# Patient Record
Sex: Female | Born: 1975 | Race: White | Hispanic: No | Marital: Married | Smoking: Never smoker
Health system: Southern US, Community
[De-identification: ages and names within clinical notes are randomized; demographics above are authoritative.]

## PROBLEM LIST (undated history)

## (undated) DIAGNOSIS — R5383 Other fatigue: Secondary | ICD-10-CM

## (undated) DIAGNOSIS — K219 Gastro-esophageal reflux disease without esophagitis: Secondary | ICD-10-CM

## (undated) DIAGNOSIS — J45909 Unspecified asthma, uncomplicated: Secondary | ICD-10-CM

## (undated) DIAGNOSIS — U071 COVID-19: Secondary | ICD-10-CM

## (undated) DIAGNOSIS — F909 Attention-deficit hyperactivity disorder, unspecified type: Secondary | ICD-10-CM

## (undated) DIAGNOSIS — T7840XA Allergy, unspecified, initial encounter: Secondary | ICD-10-CM

## (undated) DIAGNOSIS — F419 Anxiety disorder, unspecified: Secondary | ICD-10-CM

## (undated) HISTORY — DX: Attention-deficit hyperactivity disorder, unspecified type: F90.9

## (undated) HISTORY — DX: COVID-19: U07.1

## (undated) HISTORY — PX: LAMINECTOMY: SHX219

## (undated) HISTORY — DX: Allergy, unspecified, initial encounter: T78.40XA

## (undated) HISTORY — DX: Other fatigue: R53.83

## (undated) HISTORY — DX: Anxiety disorder, unspecified: F41.9

## (undated) HISTORY — PX: SPINE SURGERY: SHX786

## (undated) HISTORY — DX: Unspecified asthma, uncomplicated: J45.909

## (undated) HISTORY — DX: Gastro-esophageal reflux disease without esophagitis: K21.9

---

## 1995-08-31 DIAGNOSIS — Z8742 Personal history of other diseases of the female genital tract: Secondary | ICD-10-CM | POA: Insufficient documentation

## 1999-01-20 ENCOUNTER — Other Ambulatory Visit: Admission: RE | Admit: 1999-01-20 | Discharge: 1999-01-20 | Payer: Self-pay | Admitting: Obstetrics and Gynecology

## 1999-11-16 ENCOUNTER — Other Ambulatory Visit: Admission: RE | Admit: 1999-11-16 | Discharge: 1999-11-16 | Payer: Self-pay | Admitting: Obstetrics & Gynecology

## 2000-01-13 ENCOUNTER — Ambulatory Visit (HOSPITAL_COMMUNITY): Admission: RE | Admit: 2000-01-13 | Discharge: 2000-01-13 | Payer: Self-pay | Admitting: Obstetrics & Gynecology

## 2000-01-13 ENCOUNTER — Encounter: Payer: Self-pay | Admitting: Obstetrics & Gynecology

## 2000-01-27 ENCOUNTER — Ambulatory Visit (HOSPITAL_COMMUNITY): Admission: RE | Admit: 2000-01-27 | Discharge: 2000-01-27 | Payer: Self-pay | Admitting: Obstetrics and Gynecology

## 2000-01-27 ENCOUNTER — Encounter: Payer: Self-pay | Admitting: Obstetrics and Gynecology

## 2000-03-27 ENCOUNTER — Inpatient Hospital Stay (HOSPITAL_COMMUNITY): Admission: AD | Admit: 2000-03-27 | Discharge: 2000-03-27 | Payer: Self-pay | Admitting: Obstetrics & Gynecology

## 2000-04-11 ENCOUNTER — Inpatient Hospital Stay (HOSPITAL_COMMUNITY): Admission: AD | Admit: 2000-04-11 | Discharge: 2000-04-11 | Payer: Self-pay | Admitting: Obstetrics and Gynecology

## 2000-06-13 ENCOUNTER — Inpatient Hospital Stay (HOSPITAL_COMMUNITY): Admission: AD | Admit: 2000-06-13 | Discharge: 2000-06-15 | Payer: Self-pay | Admitting: Obstetrics and Gynecology

## 2000-12-14 ENCOUNTER — Other Ambulatory Visit: Admission: RE | Admit: 2000-12-14 | Discharge: 2000-12-14 | Payer: Self-pay | Admitting: Obstetrics and Gynecology

## 2003-05-29 ENCOUNTER — Ambulatory Visit (HOSPITAL_COMMUNITY): Admission: RE | Admit: 2003-05-29 | Discharge: 2003-05-29 | Payer: Self-pay | Admitting: Obstetrics and Gynecology

## 2003-05-29 ENCOUNTER — Encounter: Payer: Self-pay | Admitting: Obstetrics and Gynecology

## 2004-04-23 ENCOUNTER — Ambulatory Visit (HOSPITAL_COMMUNITY): Admission: RE | Admit: 2004-04-23 | Discharge: 2004-04-23 | Payer: Self-pay | Admitting: Obstetrics and Gynecology

## 2004-08-14 ENCOUNTER — Ambulatory Visit (HOSPITAL_BASED_OUTPATIENT_CLINIC_OR_DEPARTMENT_OTHER): Admission: RE | Admit: 2004-08-14 | Discharge: 2004-08-14 | Payer: Self-pay | Admitting: Urology

## 2004-08-14 ENCOUNTER — Ambulatory Visit (HOSPITAL_COMMUNITY): Admission: RE | Admit: 2004-08-14 | Discharge: 2004-08-14 | Payer: Self-pay | Admitting: Urology

## 2005-03-04 ENCOUNTER — Observation Stay (HOSPITAL_COMMUNITY): Admission: RE | Admit: 2005-03-04 | Discharge: 2005-03-05 | Payer: Self-pay | Admitting: Specialist

## 2008-04-12 ENCOUNTER — Ambulatory Visit: Payer: Self-pay | Admitting: Family Medicine

## 2011-08-20 ENCOUNTER — Ambulatory Visit: Payer: Self-pay | Admitting: Family Medicine

## 2015-02-06 ENCOUNTER — Other Ambulatory Visit: Payer: Self-pay | Admitting: Family Medicine

## 2015-02-06 DIAGNOSIS — F329 Major depressive disorder, single episode, unspecified: Secondary | ICD-10-CM

## 2015-02-06 DIAGNOSIS — F32A Depression, unspecified: Secondary | ICD-10-CM

## 2015-03-31 ENCOUNTER — Other Ambulatory Visit: Payer: Self-pay | Admitting: Family Medicine

## 2016-11-22 ENCOUNTER — Encounter: Payer: Self-pay | Admitting: Family Medicine

## 2016-11-22 ENCOUNTER — Ambulatory Visit (INDEPENDENT_AMBULATORY_CARE_PROVIDER_SITE_OTHER): Payer: 59 | Admitting: Family Medicine

## 2016-11-22 DIAGNOSIS — K219 Gastro-esophageal reflux disease without esophagitis: Secondary | ICD-10-CM

## 2016-11-22 DIAGNOSIS — E669 Obesity, unspecified: Secondary | ICD-10-CM | POA: Diagnosis not present

## 2016-11-22 DIAGNOSIS — F419 Anxiety disorder, unspecified: Secondary | ICD-10-CM

## 2016-11-22 DIAGNOSIS — J4540 Moderate persistent asthma, uncomplicated: Secondary | ICD-10-CM | POA: Diagnosis not present

## 2016-11-22 MED ORDER — QVAR REDIHALER 80 MCG/ACT IN AERB: 2.0000 | INHALATION_SPRAY | Freq: Two times a day (BID) | RESPIRATORY_TRACT | 5 refills | Status: DC

## 2016-11-22 MED ORDER — SERTRALINE HCL 100 MG PO TABS: 100.0000 mg | ORAL_TABLET | Freq: Every day | ORAL | 2 refills | Status: DC

## 2016-11-22 NOTE — Progress Notes (Signed)
Tommi Rumps, MD Phone: [phone]  Kelly [name] is a 41 y.o. female who presents today for new patient visit.  Anxiety: Patient has been on Zoloft for about 10 years. She feels it is difficult to shut down at night and has difficulty falling asleep at times. Notes it does not feel as though it is working quite as well. She's never had to go up on the dose. 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 She notes no depression.  Asthma: Using Qvar daily 160 g. Is pretty much using her albuterol daily as well. Particularly if working out. No nighttime symptoms. Does wheeze. It's been about 6 months since she required steroids.  She's been working on weight loss by decreasing sweet tea intake. She's also cut down on carbs. She's starting to exercise. Drinking more water.  She has a history of reflux. No longer has any symptoms. She takes medication for this.  Active Ambulatory Problems    Diagnosis Date Noted  . Anxiety 11/23/2016  . Asthma 11/23/2016  . Obesity (BMI 35.0-39.9 without comorbidity) 11/23/2016  . GERD (gastroesophageal reflux disease) 11/23/2016   Resolved Ambulatory Problems    Diagnosis Date Noted  . No Resolved Ambulatory Problems   Past Medical History:  Diagnosis Date  . Allergy   . Anxiety   . Asthma     Family History  Problem Relation Age of Onset  . Hypertension Mother   . Non-Hodgkin's lymphoma Father     Social History   Social History  . Marital status: Single    Spouse name: N/A  . Number of children: N/A  . Years of education: N/A   Occupational History  . Not on file.   Social History Main Topics  . Smoking status: Never Smoker  . Smokeless tobacco: Never Used  . Alcohol use No  . Drug use: No  . Sexual activity: Not on file   Other Topics Concern  . Not on file   Social History Narrative  . No narrative on file    ROS  General:  Negative for nexplained weight loss, fever Skin: Negative for new or  changing mole, sore that won't heal HEENT: Negative for trouble hearing, trouble seeing, ringing in ears, mouth sores, hoarseness, change in voice, dysphagia. CV:  Negative for chest pain, dyspnea, edema, palpitations Resp: Negative for cough, dyspnea, hemoptysis GI: Negative for nausea, vomiting, diarrhea, constipation, abdominal pain, melena, hematochezia. GU: Negative for dysuria, incontinence, urinary hesitance, hematuria, vaginal or penile discharge, polyuria, sexual difficulty, lumps in testicle or breasts MSK: Negative for muscle cramps or aches, joint pain or swelling Neuro: Negative for headaches, weakness, numbness, dizziness, passing out/fainting Psych: Positive for anxiety, Negative for depression, memory problems  Objective  Physical Exam Vitals:   11/22/16 1338  BP: 130/90  Pulse: 95  Temp: 99.2 F (37.3 C)    BP Readings from Last 3 Encounters:  11/22/16 130/90   Wt Readings from Last 3 Encounters:  11/22/16 204 lb (92.5 kg)    Physical Exam  Constitutional: No distress.  HENT:  Head: Normocephalic and atraumatic.  Mouth/Throat: Oropharynx is clear and moist. No oropharyngeal exudate.  Eyes: Conjunctivae are normal. Pupils are equal, round, and reactive to light.  Neck: Neck supple.  Cardiovascular: Normal rate, regular rhythm and normal heart sounds.   Pulmonary/Chest: Effort normal and breath sounds normal.  Abdominal: Soft. Bowel sounds are normal. She exhibits no distension. There is no tenderness. There is no rebound and  no guarding.  Musculoskeletal: She exhibits no edema.  Lymphadenopathy:    She has no cervical adenopathy.  Neurological: She is alert. Gait normal.  Skin: Skin is warm and dry. She is not diaphoretic.  Psychiatric:  Mood anxious, affect anxious     Assessment/Plan:   Anxiety Patient with slight worsening of anxiety. We will have her increase her Zoloft to 100 mg daily. She'll follow-up in about 8 weeks to see if this is  beneficial.  Asthma Patient with daily symptoms. We will increase her Qvar to 160 g twice daily. She can continue to use her albuterol as needed. She will use 2 puffs prior to exercising to see if this is beneficial. We'll see how she does at follow-up in 2 months.  Obesity (BMI 35.0-39.9 without comorbidity) Advised to continue to work on diet and exercise.  GERD (gastroesophageal reflux disease) Symptomatically. Continue PPI. Continue to monitor.   No orders of the defined types were placed in this encounter.    Tommi Rumps, MD [place]

## 2016-11-22 NOTE — Patient Instructions (Signed)
Nice to meet you. We will increase your Zoloft. If your anxiety worsens please let us know. We'll also increase your Qvar. If your wheezing worsens please let us know. Please continue with diet and exercise.

## 2016-11-22 NOTE — Progress Notes (Signed)
Pre visit review using our clinic review tool, if applicable. No additional management support is needed unless otherwise documented below in the visit note. 

## 2016-11-23 DIAGNOSIS — F419 Anxiety disorder, unspecified: Secondary | ICD-10-CM | POA: Insufficient documentation

## 2016-11-23 DIAGNOSIS — E669 Obesity, unspecified: Secondary | ICD-10-CM | POA: Insufficient documentation

## 2016-11-23 DIAGNOSIS — K219 Gastro-esophageal reflux disease without esophagitis: Secondary | ICD-10-CM | POA: Insufficient documentation

## 2016-11-23 DIAGNOSIS — J45909 Unspecified asthma, uncomplicated: Secondary | ICD-10-CM | POA: Insufficient documentation

## 2016-11-23 DIAGNOSIS — Z6834 Body mass index (BMI) 34.0-34.9, adult: Secondary | ICD-10-CM | POA: Insufficient documentation

## 2016-11-23 DIAGNOSIS — Z6832 Body mass index (BMI) 32.0-32.9, adult: Secondary | ICD-10-CM | POA: Insufficient documentation

## 2016-11-23 NOTE — Assessment & Plan Note (Signed)
Symptomatically. Continue PPI. Continue to monitor.

## 2016-11-23 NOTE — Assessment & Plan Note (Signed)
Patient with slight worsening of anxiety. We will have her increase her Zoloft to 100 mg daily. She'll follow-up in about 8 weeks to see if this is beneficial.

## 2016-11-23 NOTE — Assessment & Plan Note (Signed)
Advised to continue to work on diet and exercise.

## 2016-11-23 NOTE — Assessment & Plan Note (Signed)
Patient with daily symptoms. We will increase her Qvar to 160 g twice daily. She can continue to use her albuterol as needed. She will use 2 puffs prior to exercising to see if this is beneficial. We'll see how she does at follow-up in 2 months.

## 2016-12-02 ENCOUNTER — Other Ambulatory Visit: Payer: Self-pay

## 2016-12-02 LAB — HM PAP SMEAR: HM Pap smear: NORMAL

## 2016-12-02 MED ORDER — OMEPRAZOLE 40 MG PO CPDR: 40.0000 mg | DELAYED_RELEASE_CAPSULE | Freq: Every day | ORAL | 3 refills | Status: DC

## 2016-12-02 MED ORDER — VENTOLIN HFA 108 (90 BASE) MCG/ACT IN AERS: 2.0000 | INHALATION_SPRAY | Freq: Four times a day (QID) | RESPIRATORY_TRACT | 1 refills | Status: DC | PRN

## 2016-12-02 MED ORDER — MONTELUKAST SODIUM 10 MG PO TABS: 10.0000 mg | ORAL_TABLET | Freq: Every day | ORAL | 3 refills | Status: DC

## 2016-12-02 NOTE — Telephone Encounter (Signed)
Filed under historical last OV 11/22/16

## 2016-12-02 NOTE — Telephone Encounter (Signed)
Ventolin filed under historical Omeprazole filed under historical

## 2016-12-02 NOTE — Telephone Encounter (Signed)
Sent to pharmacy 

## 2016-12-06 ENCOUNTER — Other Ambulatory Visit: Payer: Self-pay

## 2016-12-06 MED ORDER — SERTRALINE HCL 100 MG PO TABS: 100.0000 mg | ORAL_TABLET | Freq: Every day | ORAL | 2 refills | Status: DC

## 2016-12-06 NOTE — Telephone Encounter (Signed)
Patient [facility], sent to pharmacy

## 2017-01-20 ENCOUNTER — Other Ambulatory Visit: Payer: Self-pay | Admitting: Family Medicine

## 2017-01-26 ENCOUNTER — Ambulatory Visit (INDEPENDENT_AMBULATORY_CARE_PROVIDER_SITE_OTHER): Payer: No Typology Code available for payment source | Admitting: Family Medicine

## 2017-01-26 ENCOUNTER — Encounter: Payer: Self-pay | Admitting: Family Medicine

## 2017-01-26 DIAGNOSIS — R21 Rash and other nonspecific skin eruption: Secondary | ICD-10-CM | POA: Diagnosis not present

## 2017-01-26 DIAGNOSIS — E669 Obesity, unspecified: Secondary | ICD-10-CM

## 2017-01-26 DIAGNOSIS — F419 Anxiety disorder, unspecified: Secondary | ICD-10-CM

## 2017-01-26 DIAGNOSIS — J4540 Moderate persistent asthma, uncomplicated: Secondary | ICD-10-CM | POA: Diagnosis not present

## 2017-01-26 NOTE — Assessment & Plan Note (Signed)
Suspect allergic dermatitis. Encouraged continued as needed Benadryl and daily 24-hour antihistamine. She can use hydrocortisone with the exception of on her face for the most pruritic areas. If not improving she'll let us know.

## 2017-01-26 NOTE — Assessment & Plan Note (Signed)
Quite a bit improved. She'll continue Qvar. As needed albuterol as well. If worsen she'll let us know.

## 2017-01-26 NOTE — Patient Instructions (Signed)
Nice to see you. I would continue as needed Benadryl and then daily 24-hour antihistamine for your rash. If this does not improve let us know. Please continue diet and exercise.

## 2017-01-26 NOTE — Progress Notes (Signed)
  Tommi Rumps, MD Phone: [phone]  Kelly [name] is a 41 y.o. female who presents today for follow-up.  Anxiety: Taking Zoloft. Notes her anxiety significantly improved. She switched jobs and has less responsibility now. No depression.  Obesity: Is walking at night 3-4 times a week. Not snacking as much. Is eating healthier snacks. She is trying to go back to unsweet tea. She's been making changes over the last 2 weeks.  Asthma: Patient notes this has significantly improved. She is using Qvar twice daily. Uses albuterol 1-2 times a week typically if she is walking for exercise. She notes no wheezing, coughing, nighttime symptoms, or shortness of breath.  Rash: Patient notes onset of rash yesterday. Notes started with bumps on her face and neck and then also had bumps on her arms. Thinks it may be due to exposure to carbon being pulled up. Notes it itches a little bit. She has tried Benadryl in a 24-hour allergy pill with some benefit. No anaphylactic-like reaction.  PMH: nonsmoker.   ROS see history of present illness  Objective  Physical Exam Vitals:   01/26/17 1426  BP: 128/70  Pulse: 84  Temp: 98.3 F (36.8 C)    BP Readings from Last 3 Encounters:  01/26/17 128/70  11/22/16 130/90   Wt Readings from Last 3 Encounters:  01/26/17 205 lb 6.4 oz (93.2 kg)  11/22/16 204 lb (92.5 kg)    Physical Exam  Constitutional: No distress.  Cardiovascular: Normal rate, regular rhythm and normal heart sounds.   Pulmonary/Chest: Effort normal and breath sounds normal.  Musculoskeletal: She exhibits no edema.  Neurological: She is alert. Gait normal.  Skin: She is not diaphoretic.  Scattered erythematous papules on face and neck and lower arms     Assessment/Plan: Please see individual problem list.  Asthma Quite a bit improved. She'll continue Qvar. As needed albuterol as well. If worsen she'll let us know.  Anxiety Much improved. Continue Zoloft.  Obesity (BMI  35.0-39.9 without comorbidity) Weight is unchanged though she just recently started diet and exercise changes. Encouraged her to continue these.  Rash Suspect allergic dermatitis. Encouraged continued as needed Benadryl and daily 24-hour antihistamine. She can use hydrocortisone with the exception of on her face for the most pruritic areas. If not improving she'll let us know.  Tommi Rumps, MD [place]

## 2017-01-26 NOTE — Assessment & Plan Note (Signed)
Much improved. Continue Zoloft.

## 2017-01-26 NOTE — Assessment & Plan Note (Signed)
Weight is unchanged though she just recently started diet and exercise changes. Encouraged her to continue these.

## 2017-01-31 ENCOUNTER — Telehealth: Payer: Self-pay | Admitting: Family Medicine

## 2017-01-31 MED ORDER — VENTOLIN HFA 108 (90 BASE) MCG/ACT IN AERS: 2.0000 | INHALATION_SPRAY | Freq: Four times a day (QID) | RESPIRATORY_TRACT | 1 refills | Status: DC | PRN

## 2017-01-31 NOTE — Telephone Encounter (Signed)
Pt called requesting a refill on VENTOLIN HFA 108 (90 Base) MCG/ACT inhaler. Please advise, thank you!  Pharmacy - [id number] - [place], [place]

## 2017-01-31 NOTE — Telephone Encounter (Signed)
Sent to pharmacy 

## 2017-03-24 ENCOUNTER — Telehealth: Payer: Self-pay | Admitting: *Deleted

## 2017-03-24 MED ORDER — OMEPRAZOLE 40 MG PO CPDR: 40.0000 mg | DELAYED_RELEASE_CAPSULE | Freq: Every day | ORAL | 3 refills | Status: DC

## 2017-03-24 NOTE — Telephone Encounter (Signed)
rx sent thanks 

## 2017-03-24 NOTE — Telephone Encounter (Signed)
Pt has requested a medication refill for omeprazole  Pharmacy [facility]

## 2017-05-30 ENCOUNTER — Other Ambulatory Visit: Payer: Self-pay | Admitting: Family Medicine

## 2017-07-29 ENCOUNTER — Ambulatory Visit: Payer: No Typology Code available for payment source | Admitting: Family Medicine

## 2017-08-11 ENCOUNTER — Other Ambulatory Visit: Payer: Self-pay | Admitting: Family Medicine

## 2017-09-19 ENCOUNTER — Other Ambulatory Visit: Payer: Self-pay | Admitting: Family Medicine

## 2017-09-20 NOTE — Telephone Encounter (Signed)
Last OV 01/26/17 last filled 05/30/17 18 1rf

## 2017-09-21 ENCOUNTER — Telehealth: Payer: Self-pay | Admitting: Family Medicine

## 2017-09-21 NOTE — Telephone Encounter (Signed)
Copied from [place]. Topic: Inquiry >> Sep 21, 2017 11:21 AM Pricilla Handler wrote: Reason for CRM: Patient called requesting a refill of VENTOLIN HFA 108 (90 Base) MCG/ACT inhaler. Patient is completely out. Patient's preferred pharmacy is [facility] - [place].  [phone] (Phone) [phone] (Fax)       Thank You!!!

## 2017-09-21 NOTE — Telephone Encounter (Signed)
Copied from [place]. Topic: Inquiry >> Sep 21, 2017 11:21 AM Pricilla Handler wrote: Reason for CRM: Patient called requesting a refill of VENTOLIN HFA 108 (90 Base) MCG/ACT inhaler. Patient is completely out. Patient's preferred pharmacy is [facility] - [place] - [place].  [phone] (Phone) [phone] (Fax)       Thank You!!!

## 2017-09-21 NOTE — Telephone Encounter (Signed)
Last OV 01/26/17 last filled 09/20/17 Already sent to pharmacy

## 2017-12-12 ENCOUNTER — Other Ambulatory Visit: Payer: Self-pay | Admitting: Family Medicine

## 2017-12-19 NOTE — Telephone Encounter (Signed)
Okay to refill? Last seen in May 2018. No future appt scheduled.

## 2017-12-19 NOTE — Telephone Encounter (Signed)
30-day refill sent in.  Please contact the patient to get her set up for follow-up.

## 2017-12-20 ENCOUNTER — Encounter: Payer: Self-pay | Admitting: *Deleted

## 2017-12-20 NOTE — Telephone Encounter (Signed)
Mailed letter °

## 2017-12-22 ENCOUNTER — Other Ambulatory Visit: Payer: Self-pay | Admitting: Family Medicine

## 2017-12-22 ENCOUNTER — Other Ambulatory Visit: Payer: Self-pay | Admitting: *Deleted

## 2017-12-22 MED ORDER — ALBUTEROL SULFATE HFA 108 (90 BASE) MCG/ACT IN AERS: 2.0000 | INHALATION_SPRAY | Freq: Four times a day (QID) | RESPIRATORY_TRACT | 0 refills | Status: DC | PRN

## 2017-12-23 ENCOUNTER — Other Ambulatory Visit: Payer: Self-pay | Admitting: Family Medicine

## 2017-12-23 ENCOUNTER — Other Ambulatory Visit: Payer: Self-pay | Admitting: *Deleted

## 2017-12-23 MED ORDER — ALBUTEROL SULFATE HFA 108 (90 BASE) MCG/ACT IN AERS: 2.0000 | INHALATION_SPRAY | Freq: Four times a day (QID) | RESPIRATORY_TRACT | 0 refills | Status: DC | PRN

## 2017-12-23 NOTE — Progress Notes (Signed)
Patient has scheduled appointment original printed prescription not found recent to [facility] via [place].

## 2018-01-09 ENCOUNTER — Telehealth: Payer: Self-pay | Admitting: Family Medicine

## 2018-01-09 DIAGNOSIS — R5383 Other fatigue: Secondary | ICD-10-CM

## 2018-01-09 NOTE — Telephone Encounter (Signed)
OK to place order for TSH?

## 2018-01-09 NOTE — Telephone Encounter (Signed)
Copied from [place] [id number]. Topic: Quick Communication - See Telephone Encounter >> Jan 09, 2018 12:01 PM Ether Griffins B wrote: CRM for notification. See Telephone encounter for: 01/09/18.  Pt would like an order placed to have thyroid checked before appt with Dr. Caryl Bis on 01/25/18. Please advise.

## 2018-01-10 NOTE — Telephone Encounter (Signed)
Noted.  I have placed orders.  I am happy to have her have these completed prior to her visit though there may be additional orders that need to be collected once I have spoken with her.

## 2018-01-10 NOTE — Telephone Encounter (Signed)
Please find out if there is a specific reason she wants this checked.  She has no history of hypothyroidism that I can tell on review of her chart.  She likely will need additional labs as well given that we do not have any in our system.  We could have her come in for fasting labs.

## 2018-01-10 NOTE — Telephone Encounter (Signed)
Patient notified and voiced understanding.

## 2018-01-10 NOTE — Telephone Encounter (Signed)
Patient says for the last four or five months just no energy , weight gain , hair thinning, and thyroid HX in family, craving ice, dry skin.  Patient thought maybe with family HX and with the lack of energy feels like she wants to nap and sleep.

## 2018-01-20 ENCOUNTER — Other Ambulatory Visit (INDEPENDENT_AMBULATORY_CARE_PROVIDER_SITE_OTHER): Payer: No Typology Code available for payment source

## 2018-01-20 DIAGNOSIS — R5383 Other fatigue: Secondary | ICD-10-CM | POA: Diagnosis not present

## 2018-01-20 LAB — CBC WITH DIFFERENTIAL/PLATELET
BASOS PCT: 0.8 % (ref 0.0–3.0)
Basophils Absolute: 0.1 10*3/uL (ref 0.0–0.1)
EOS PCT: 4.2 % (ref 0.0–5.0)
Eosinophils Absolute: 0.3 10*3/uL (ref 0.0–0.7)
HCT: 31.3 % — ABNORMAL LOW (ref 36.0–46.0)
HEMOGLOBIN: 9.7 g/dL — AB (ref 12.0–15.0)
LYMPHS ABS: 1.9 10*3/uL (ref 0.7–4.0)
Lymphocytes Relative: 28.3 % (ref 12.0–46.0)
MCHC: 30.8 g/dL (ref 30.0–36.0)
MONO ABS: 0.4 10*3/uL (ref 0.1–1.0)
MONOS PCT: 5.5 % (ref 3.0–12.0)
NEUTROS PCT: 61.2 % (ref 43.0–77.0)
Neutro Abs: 4.2 10*3/uL (ref 1.4–7.7)
Platelets: 295 10*3/uL (ref 150.0–400.0)
RBC: 4.77 Mil/uL (ref 3.87–5.11)
RDW: 18.8 % — AB (ref 11.5–15.5)
WBC: 6.8 10*3/uL (ref 4.0–10.5)

## 2018-01-20 LAB — COMPREHENSIVE METABOLIC PANEL
ALT: 18 U/L (ref 0–35)
AST: 14 U/L (ref 0–37)
Albumin: 3.9 g/dL (ref 3.5–5.2)
Alkaline Phosphatase: 76 U/L (ref 39–117)
BUN: 11 mg/dL (ref 6–23)
CO2: 26 meq/L (ref 19–32)
CREATININE: 0.76 mg/dL (ref 0.40–1.20)
Calcium: 9.3 mg/dL (ref 8.4–10.5)
Chloride: 107 mEq/L (ref 96–112)
GFR: 88.94 mL/min (ref >60.00)
GLUCOSE: 96 mg/dL (ref 70–99)
POTASSIUM: 4 meq/L (ref 3.5–5.1)
SODIUM: 140 meq/L (ref 135–145)
Total Bilirubin: 0.4 mg/dL (ref 0.2–1.2)
Total Protein: 6.8 g/dL (ref 6.0–8.3)

## 2018-01-20 LAB — HEMOGLOBIN A1C: HEMOGLOBIN A1C: 6.1 % (ref 4.6–6.5)

## 2018-01-20 LAB — TSH: TSH: 1.79 u[IU]/mL (ref 0.35–4.50)

## 2018-01-24 ENCOUNTER — Encounter: Payer: Self-pay | Admitting: Family Medicine

## 2018-01-25 ENCOUNTER — Telehealth: Payer: Self-pay | Admitting: Family Medicine

## 2018-01-25 ENCOUNTER — Ambulatory Visit: Payer: No Typology Code available for payment source | Admitting: Family Medicine

## 2018-01-25 ENCOUNTER — Encounter: Payer: Self-pay | Admitting: Family Medicine

## 2018-01-25 VITALS — BP 128/90 | HR 87 | Temp 98.3°F | Ht 63.0 in | Wt 201.8 lb

## 2018-01-25 DIAGNOSIS — G44209 Tension-type headache, unspecified, not intractable: Secondary | ICD-10-CM | POA: Diagnosis not present

## 2018-01-25 DIAGNOSIS — N920 Excessive and frequent menstruation with regular cycle: Secondary | ICD-10-CM

## 2018-01-25 DIAGNOSIS — J4540 Moderate persistent asthma, uncomplicated: Secondary | ICD-10-CM | POA: Diagnosis not present

## 2018-01-25 DIAGNOSIS — D649 Anemia, unspecified: Secondary | ICD-10-CM | POA: Diagnosis not present

## 2018-01-25 DIAGNOSIS — F419 Anxiety disorder, unspecified: Secondary | ICD-10-CM | POA: Diagnosis not present

## 2018-01-25 LAB — CBC
HCT: 30.9 % — ABNORMAL LOW (ref 36.0–46.0)
Hemoglobin: 9.6 g/dL — ABNORMAL LOW (ref 12.0–15.0)
MCHC: 30.9 g/dL (ref 30.0–36.0)
PLATELETS: 339 10*3/uL (ref 150.0–400.0)
RBC: 4.74 Mil/uL (ref 3.87–5.11)
RDW: 18.7 % — ABNORMAL HIGH (ref 11.5–15.5)
WBC: 8.7 10*3/uL (ref 4.0–10.5)

## 2018-01-25 NOTE — Patient Instructions (Addendum)
Nice to see you. We will get you an appointment with your gynecologist. Please contact us to let us know what inhaler you are using at home. We will check lab work today and contact you with the results. Please bring the stool cards back and when you do that you will provide a urine sample. If you develop chest pain, shortness of breath, persistent heavy vaginal bleeding, or any new or change in symptoms please be evaluated.

## 2018-01-25 NOTE — Telephone Encounter (Signed)
Copied from [place] [id number]. Topic: Quick Communication - Lab Results >> Jan 25, 2018  9:41 AM Kelly [name], CMA wrote: Left message to return call, ok for [facility] to give results and speak to patient

## 2018-01-25 NOTE — Assessment & Plan Note (Signed)
I suspect this is the cause of her fatigue.  I suspect this is related to her heavy menstrual cycles.  We will refer her back to her gynecologist for evaluation of that aspect.  We will check iron studies.  She is given stool cards to complete.  When she returns with the stool cards she will have a urinalysis completed when she is off of her menstrual cycle.  She is given return precautions.

## 2018-01-25 NOTE — Assessment & Plan Note (Signed)
Currently asymptomatic 

## 2018-01-25 NOTE — Telephone Encounter (Signed)
Left message for pt to return call for lab results.

## 2018-01-25 NOTE — Assessment & Plan Note (Signed)
Uncontrolled.  She is not using the Qvar that she should be using.  The inhaler she is using potentially could be expired as well.  She will check on what she is using and send [place] a message.  We will then send Qvar in for her.

## 2018-01-25 NOTE — Progress Notes (Signed)
Tommi Rumps, MD Phone: [phone]  Kelly [name] is a 42 y.o. female who presents today for f/u.  CC: fatigue  She notes over the last year she has had some issues with fatigue.  She just feels the need to nap.  Has some trouble focusing which she felt may have been related to not taking her Adderall consistently.  Not much motivation.  She has been craving ice.  She had lab work last week that revealed that she is anemic.  She notes her menstrual cycles are heavy for the first 3 days with having to change her pad every hour.  Lasts up to 7 days.  Occurs once a month.  No intermenstrual bleeding.  No depression or anxiety.  She continues on Zoloft.  She does follow with gynecology.  Asthma: She is using an inhaler though it is not her Qvar.  She may be using Advair.  She has had to use her albuterol 2 to 3 puffs/day over the last year.  She does note this is beneficial.  She uses this when she feels a little out of breath.  She notes no coughing.  Occasional wheezing.  She notes chronic tension headaches.  She notes they occur in her bilateral temples.  She notes no numbness, weakness, or vision changes with these.  Occasionally she will wake up with one that has been going on for a long time.  Her headache pattern is unchanged.  Responds to Excedrin Migraine.  Social History   Tobacco Use  Smoking Status Never Smoker  Smokeless Tobacco Never Used     ROS see history of present illness  Objective  Physical Exam Vitals:   01/25/18 1441  BP: 128/90  Pulse: 87  Temp: 98.3 F (36.8 C)  SpO2: 95%    BP Readings from Last 3 Encounters:  01/25/18 128/90  01/26/17 128/70  11/22/16 130/90   Wt Readings from Last 3 Encounters:  01/25/18 201 lb 12.8 oz (91.5 kg)  01/26/17 205 lb 6.4 oz (93.2 kg)  11/22/16 204 lb (92.5 kg)    Physical Exam  Constitutional: No distress.  Cardiovascular: Normal rate, regular rhythm and normal heart sounds.  Pulmonary/Chest: Effort normal  and breath sounds normal.  Abdominal: Soft. Bowel sounds are normal. She exhibits no distension. There is no tenderness.  Musculoskeletal: She exhibits no edema.  Neurological: She is alert.  CN 2-12 intact, 5/5 strength in bilateral biceps, triceps, grip, quads, hamstrings, plantar and dorsiflexion, sensation to light touch intact in bilateral UE and LE, normal gait  Skin: Skin is warm and dry. She is not diaphoretic.     Assessment/Plan: Please see individual problem list.  Asthma Uncontrolled.  She is not using the Qvar that she should be using.  The inhaler she is using potentially could be expired as well.  She will check on what she is using and send [place] a message.  We will then send Qvar in for her.  Anxiety Currently asymptomatic.  Anemia I suspect this is the cause of her fatigue.  I suspect this is related to her heavy menstrual cycles.  We will refer her back to her gynecologist for evaluation of that aspect.  We will check iron studies.  She is given stool cards to complete.  When she returns with the stool cards she will have a urinalysis completed when she is off of her menstrual cycle.  She is given return precautions.  Tension headache Patient with a history of chronic tension headaches.  Responds well to Excedrin.  No neurological symptoms.  She is neurologically intact.  Headache pattern has not changed.  She will monitor for now.  If any changes she will let us know.   Orders Placed This Encounter  Procedures  . Fecal occult blood, imunochemical    Standing Status:   Future    Standing Expiration Date:   01/26/2019  . CBC  . Iron, TIBC and Ferritin Panel  . Ambulatory referral to Gynecology    Referral Priority:   Routine    Referral Type:   Consultation    Referral Reason:   Specialty Services Required    Requested Specialty:   Gynecology    Number of Visits Requested:   1  . POCT Urinalysis Dipstick    Standing Status:   Future    Standing Expiration Date:    03/27/2018    No orders of the defined types were placed in this encounter.    Tommi Rumps, MD [place]

## 2018-01-25 NOTE — Telephone Encounter (Signed)
Patient returning call.

## 2018-01-25 NOTE — Assessment & Plan Note (Signed)
Patient with a history of chronic tension headaches.  Responds well to Excedrin.  No neurological symptoms.  She is neurologically intact.  Headache pattern has not changed.  She will monitor for now.  If any changes she will let us know.

## 2018-01-26 ENCOUNTER — Other Ambulatory Visit: Payer: Self-pay | Admitting: Family Medicine

## 2018-01-26 ENCOUNTER — Telehealth: Payer: Self-pay | Admitting: *Deleted

## 2018-01-26 DIAGNOSIS — D649 Anemia, unspecified: Secondary | ICD-10-CM

## 2018-01-26 LAB — IRON,TIBC AND FERRITIN PANEL
%SAT: 3 % (calc) — ABNORMAL LOW (ref 11–50)
Ferritin: 4 ng/mL — ABNORMAL LOW (ref 10–232)
IRON: 13 ug/dL — AB (ref 40–190)
TIBC: 476 ug/dL — AB (ref 250–450)

## 2018-01-26 MED ORDER — FERROUS SULFATE 325 (65 FE) MG PO TABS: 325.0000 mg | ORAL_TABLET | Freq: Two times a day (BID) | ORAL | 1 refills | Status: DC

## 2018-01-26 NOTE — Telephone Encounter (Signed)
-----   Message from [name], MD sent at 01/26/2018  1:56 PM EDT ----- Please let the patient know that her iron levels are quite low.  She needs to be on an iron supplement.  I have sent this to her pharmacy.  She should try to take this with orange juice.  She needs to complete the stool cards as well as the urine study when she brings the stool cards in.  We will need to recheck her levels in 1 month.  Please place an order for an iron, TIBC, and ferritin panel as well as a CBC for iron deficiency anemia.  She will also need to see her gynecologist as we discussed.  Thanks.

## 2018-01-27 NOTE — Telephone Encounter (Signed)
Kelly [name],   This patient has asthma that is uncontrolled at this time. She was previously on qvar with good benefit though it does not appear that this was affordable. Can you let me know what the affordable options would be with her insurance and are their any affordable options without insurance? Thanks.  Randall Hiss

## 2018-01-30 ENCOUNTER — Encounter: Payer: Self-pay | Admitting: Family Medicine

## 2018-01-30 MED ORDER — FLUTICASONE-SALMETEROL 55-14 MCG/ACT IN AEPB: 1.0000 | INHALATION_SPRAY | Freq: Two times a day (BID) | RESPIRATORY_TRACT | 2 refills | Status: DC

## 2018-02-01 ENCOUNTER — Other Ambulatory Visit: Payer: Self-pay | Admitting: Family Medicine

## 2018-02-01 DIAGNOSIS — D500 Iron deficiency anemia secondary to blood loss (chronic): Secondary | ICD-10-CM

## 2018-02-02 ENCOUNTER — Telehealth: Payer: Self-pay | Admitting: Family Medicine

## 2018-02-02 ENCOUNTER — Other Ambulatory Visit: Payer: Self-pay | Admitting: Family Medicine

## 2018-02-02 NOTE — Telephone Encounter (Signed)
Copied from [place] [id number]. Topic: Quick Communication - See Telephone Encounter >> Feb 02, 2018  3:33 PM Rutherford Nail, NT wrote: CRM for notification. See Telephone encounter for: 02/02/18. Patient call and states that at her visit on 01/25/18 she mentioned that she was waking up with severe headaches. Would like to know if Dr Caryl Bis could send some medication to the pharmacy. States that she take Excedrin migraine for the headaches, but with taking those she is having some burning in her stomach and thinks it is from the [place]. Please advise.   CB#: [place], [place] - [place]

## 2018-02-02 NOTE — Telephone Encounter (Signed)
Please advise 

## 2018-02-03 DIAGNOSIS — N920 Excessive and frequent menstruation with regular cycle: Secondary | ICD-10-CM | POA: Insufficient documentation

## 2018-02-03 NOTE — Telephone Encounter (Signed)
Patient states they are located near her temple, she states it feels like a migraine is coming on. She states she wakes up with these. She is experiencing them 3-4 days a week. Pain is 5 out of 10 with medicine.she tried tylenol with no relief. No numbness, no weakness and no vision changes. Patient states the Excedrin helps but she is not sure if there is something better to use since the Excedrin has aspirin in it.

## 2018-02-03 NOTE — Telephone Encounter (Signed)
Please get more details regarding this.  We did discuss her headaches though I do not necessarily remember her describing them as severe.  Please find out where they are located.  How often are they occurring?  Has she tried Tylenol?  She having any numbness, weakness, or vision changes?  Thanks.

## 2018-02-03 NOTE — Telephone Encounter (Signed)
Please advise 

## 2018-02-03 NOTE — Telephone Encounter (Signed)
I would suggest trying tylenol to start. At this point that is the best option with her other issues that we are evaluating. It sounds like these are more frequent. Given that we will need to consider imaging her head to make sure that there is no other cause for her headaches. If she develops numbness, weakness, or vision changes she needs to be evaluated.  Thanks.

## 2018-02-03 NOTE — Telephone Encounter (Signed)
Patient tried tylenol with no relief

## 2018-02-03 NOTE — Telephone Encounter (Signed)
Unfortunately we are then in a difficult situation. She has been having some stomach discomfort that is concerning for irritation of her stomach lining or possible ulcer. This means use of ibuprofen or aleve would not be a good idea. The other typical options are not great options given that she is on zoloft. She could try a combination of tylenol 500 mg with caffeine 65 mg and see if that is beneficial. Please relay the prior message regarding imaging given her frequent headaches. Thanks.

## 2018-02-03 NOTE — Telephone Encounter (Signed)
Patient notified and states she will try the Excedrin for tension headaches which has acetaminophen and caffeine only. She states she will call back for imaging if it does not help. Patient notified to be evaluated if she develops any of the symptoms mentioned

## 2018-02-06 ENCOUNTER — Other Ambulatory Visit (INDEPENDENT_AMBULATORY_CARE_PROVIDER_SITE_OTHER): Payer: No Typology Code available for payment source

## 2018-02-06 DIAGNOSIS — D649 Anemia, unspecified: Secondary | ICD-10-CM

## 2018-02-06 LAB — POCT URINALYSIS DIPSTICK
Bilirubin, UA: NEGATIVE
GLUCOSE UA: NEGATIVE
Ketones, UA: NEGATIVE
Nitrite, UA: NEGATIVE
PH UA: 5.5 (ref 5.0–8.0)
Protein, UA: NEGATIVE
RBC UA: NEGATIVE
UROBILINOGEN UA: 0.2 U/dL

## 2018-02-08 ENCOUNTER — Other Ambulatory Visit (INDEPENDENT_AMBULATORY_CARE_PROVIDER_SITE_OTHER): Payer: No Typology Code available for payment source

## 2018-02-08 ENCOUNTER — Other Ambulatory Visit: Payer: Self-pay | Admitting: Family Medicine

## 2018-02-08 DIAGNOSIS — D500 Iron deficiency anemia secondary to blood loss (chronic): Secondary | ICD-10-CM

## 2018-02-08 DIAGNOSIS — D649 Anemia, unspecified: Secondary | ICD-10-CM

## 2018-02-08 LAB — FECAL OCCULT BLOOD, IMMUNOCHEMICAL: FECAL OCCULT BLD: NEGATIVE

## 2018-03-07 ENCOUNTER — Other Ambulatory Visit: Payer: Self-pay | Admitting: Family Medicine

## 2018-03-07 DIAGNOSIS — R41840 Attention and concentration deficit: Secondary | ICD-10-CM

## 2018-03-07 NOTE — Telephone Encounter (Signed)
The patient does not appear to carry a diagnosis of ADHD.  Please find out who has been prescribing this for her.  She will need to be seen in the office to discuss her refill of this medication if it is appropriate.

## 2018-03-07 NOTE — Telephone Encounter (Signed)
This was prescribed by a historical provider patient last seen on 01-25-18 and next visit 04-28-18 would he like to refill?

## 2018-03-07 NOTE — Telephone Encounter (Signed)
Copied from [place] [id number]. Topic: Quick Communication - Rx Refill/Question >> Mar 07, 2018  9:37 AM Marin Olp L wrote: Medication: amphetamine-dextroamphetamine (ADDERALL) 20 MG tablet   Has the patient contacted their pharmacy? Yes.   (Agent: If no, request that the patient contact the pharmacy for the refill.) (Agent: If yes, when and what did the pharmacy advise?)  Preferred Pharmacy (with phone number or street name): [facility], [place] - [address] [place] [address] Phone: [phone] Fax: [phone]  Agent: Please be advised that RX refills may take up to 3 business days. We ask that you follow-up with your pharmacy.

## 2018-03-08 NOTE — Telephone Encounter (Signed)
Patient states she discussed this with you at her first appointment. She states this was filled by Delia Chimes PA from [facility] which closed down. She states she has been trying to get her records but has had no luck getting them since they have completely closed down. She states they are the ones that diagnosed her.

## 2018-03-08 NOTE — Telephone Encounter (Signed)
Noted. Given that we do not have any records we could have her retested. This would need to be done prior to me prescribing this medication.

## 2018-03-09 NOTE — Telephone Encounter (Signed)
Patient states she is ok with retesting. She would like to have this done as soon as possible because she does not have any medication left.

## 2018-03-12 NOTE — Telephone Encounter (Signed)
Referral placed. I will forward to Melissa to get this set up as quickly as possible.

## 2018-03-13 NOTE — Telephone Encounter (Signed)
Referral has been sent.

## 2018-03-17 ENCOUNTER — Telehealth: Payer: Self-pay | Admitting: Obstetrics and Gynecology

## 2018-03-17 NOTE — Telephone Encounter (Signed)
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 Please advise.

## 2018-03-20 NOTE — Telephone Encounter (Signed)
appt made

## 2018-03-27 ENCOUNTER — Ambulatory Visit: Payer: No Typology Code available for payment source | Admitting: Gastroenterology

## 2018-03-29 ENCOUNTER — Encounter: Payer: Self-pay | Admitting: Family Medicine

## 2018-03-29 NOTE — Telephone Encounter (Signed)
Please contact the patients pharmacy to confirm this dosing and medication. Thanks.

## 2018-03-30 ENCOUNTER — Other Ambulatory Visit: Payer: Self-pay | Admitting: Family Medicine

## 2018-03-30 NOTE — Telephone Encounter (Signed)
Topiramate 50 mg take 1 tablet by mouth twice daily per [facility]  #60 0rf

## 2018-03-31 NOTE — Telephone Encounter (Signed)
Last OV 01/25/18

## 2018-03-31 NOTE — Telephone Encounter (Signed)
Sent to pharmacy.  Dosing was confirmed in the my chart message.

## 2018-04-12 ENCOUNTER — Other Ambulatory Visit (INDEPENDENT_AMBULATORY_CARE_PROVIDER_SITE_OTHER): Payer: Self-pay

## 2018-04-12 ENCOUNTER — Ambulatory Visit (INDEPENDENT_AMBULATORY_CARE_PROVIDER_SITE_OTHER): Payer: Self-pay | Admitting: Obstetrics and Gynecology

## 2018-04-12 ENCOUNTER — Other Ambulatory Visit: Payer: Self-pay | Admitting: Obstetrics and Gynecology

## 2018-04-12 ENCOUNTER — Encounter: Payer: Self-pay | Admitting: Obstetrics and Gynecology

## 2018-04-12 VITALS — BP 120/82 | HR 83 | Ht 63.0 in | Wt 193.8 lb

## 2018-04-12 DIAGNOSIS — R102 Pelvic and perineal pain: Secondary | ICD-10-CM

## 2018-04-12 DIAGNOSIS — D649 Anemia, unspecified: Secondary | ICD-10-CM

## 2018-04-12 DIAGNOSIS — N920 Excessive and frequent menstruation with regular cycle: Secondary | ICD-10-CM

## 2018-04-12 DIAGNOSIS — D259 Leiomyoma of uterus, unspecified: Secondary | ICD-10-CM

## 2018-04-12 DIAGNOSIS — G44209 Tension-type headache, unspecified, not intractable: Secondary | ICD-10-CM

## 2018-04-12 DIAGNOSIS — E669 Obesity, unspecified: Secondary | ICD-10-CM

## 2018-04-12 DIAGNOSIS — D6851 Activated protein C resistance: Secondary | ICD-10-CM

## 2018-04-12 DIAGNOSIS — F988 Other specified behavioral and emotional disorders with onset usually occurring in childhood and adolescence: Secondary | ICD-10-CM

## 2018-04-12 MED ORDER — AMPHETAMINE-DEXTROAMPHETAMINE 20 MG PO TABS: 20.0000 mg | ORAL_TABLET | Freq: Every day | ORAL | 0 refills | Status: DC

## 2018-04-12 NOTE — Patient Instructions (Signed)
Iron Deficiency Anemia, Adult Iron-deficiency anemia is when you have a low amount of red blood cells or hemoglobin. This happens because you have too little iron in your body. Hemoglobin carries oxygen to parts of the body. Anemia can cause your body to not get enough oxygen. It may or may not cause symptoms. Follow these instructions at home: Medicines  Take over-the-counter and prescription medicines only as told by your doctor. This includes iron pills (supplements) and vitamins.  If you cannot handle taking iron pills by mouth, ask your doctor about getting iron through: ? A vein (intravenously). ? A shot (injection) into a muscle.  Take iron pills when your stomach is empty. If you cannot handle this, take them with food.  Do not drink milk or take antacids at the same time as your iron pills.  To prevent trouble pooping (constipation), eat fiber or take medicine (stool softener) as told by your doctor. Eating and drinking  Talk with your doctor before changing the foods you eat. He or she may tell you to eat foods that have a lot of iron, such as: ? Liver. ? Lowfat (lean) beef. ? Breads and cereals that have iron added to them (fortified breads and cereals). ? Eggs. ? Dried fruit. ? Dark green, leafy vegetables.  Drink enough fluid to keep your pee (urine) clear or pale yellow.  Eat fresh fruits and vegetables that are high in vitamin C. They help your body to use iron. Foods with a lot of vitamin C include: ? Oranges. ? Peppers. ? Tomatoes. ? Mangoes. General instructions  Return to your normal activities as told by your doctor. Ask your doctor what activities are safe for you.  Keep yourself clean, and keep things clean around you (your surroundings). Anemia can make you get sick more easily.  Keep all follow-up visits as told by your doctor. This is important. Contact a doctor if:  You feel sick to your stomach (nauseous).  You throw up (vomit).  You feel  weak.  You are sweating for no clear reason.  You have trouble pooping, such as: ? Pooping (having a bowel movement) less than 3 times a week. ? Straining to poop. ? Having poop that is hard, dry, or larger than normal. ? Feeling full or bloated. ? Pain in the lower belly. ? Not feeling better after pooping. Get help right away if:  You pass out (faint). If this happens, do not drive yourself to the hospital. Call your local emergency services (911 in the [place]  You have chest pain.  You have shortness of breath that: ? Is very bad. ? Gets worse with physical activity.  You have a fast heartbeat.  You get light-headed when getting up from sitting or lying down. This information is not intended to replace advice given to you by your health care provider. Make sure you discuss any questions you have with your health care provider. Document Released: 09/18/2010 Document Revised: 05/05/2016 Document Reviewed: 05/05/2016 Elsevier Interactive Patient Education  2017 [employer]. Menorrhagia Menorrhagia is a condition in which menstrual periods are heavy or last longer than normal. With menorrhagia, most periods a woman has may cause enough blood loss and cramping that she becomes unable to take part in her usual activities. What are the causes? Common causes of this condition include:  Noncancerous growths in the uterus (polyps or fibroids).  An imbalance of the estrogen and progesterone hormones.  One of the ovaries not releasing an egg during one  or more months.  A problem with the thyroid gland (hypothyroid).  Side effects of having an intrauterine device (IUD).  Side effects of some medicines, such as anti-inflammatory medicines or blood thinners.  A bleeding disorder that stops the blood from clotting normally.  In some cases, the cause of this condition is not known. What are the signs or symptoms? Symptoms of this condition include:  Routinely having to change  your pad or tampon every 1-2 hours because it is completely soaked.  Needing to use pads and tampons at the same time because of heavy bleeding.  Needing to wake up to change your pads or tampons during the night.  Passing blood clots larger than 1 inch (2.5 cm) in size.  Having bleeding that lasts for more than 7 days.  Having symptoms of low iron levels (anemia), such as tiredness, fatigue, or shortness of breath.  How is this diagnosed? This condition may be diagnosed based on:  A physical exam.  Your symptoms and menstrual history.  Tests, such as: ? Blood tests to check if you are pregnant or have hormonal changes, a bleeding or thyroid disorder, anemia, or other problems. ? Pap test to check for cancerous changes, infections, or inflammation. ? Endometrial biopsy. This test involves removing a tissue sample from the lining of the uterus (endometrium) to be examined under a microscope. ? Pelvic ultrasound. This test uses sound waves to create images of your uterus, ovaries, and vagina. The images can show if you have fibroids or other growths. ? Hysteroscopy. For this test, a small telescope is used to look inside your uterus.  How is this treated? Treatment may not be needed for this condition. If it is needed, the best treatment for you will depend on:  Whether you need to prevent pregnancy.  Your desire to have children in the future.  The cause and severity of your bleeding.  Your personal preference.  Medicines are the first step in treatment. You may be treated with:  Hormonal birth control methods. These treatments reduce bleeding during your menstrual period. They include: ? Birth control pills. ? Skin patch. ? Vaginal ring. ? Shots (injections) that you get every 3 months. ? Hormonal IUD (intrauterine device). ? Implants that go under the skin.  Medicines that thicken blood and slow bleeding.  Medicines that reduce swelling, such as  ibuprofen.  Medicines that contain an artificial (synthetic) hormone called progestin.  Medicines that make the ovaries stop working for a short time.  Iron supplements to treat anemia.  If medicines do not work, surgery may be done. Surgical options may include:  Dilation and curettage (D&C). In this procedure, your health care provider opens (dilates) your cervix and then scrapes or suctions tissue from the endometrium to reduce menstrual bleeding.  Operative hysteroscopy. In this procedure, a small tube with a light on the end (hysteroscope) is used to view your uterus and help remove polyps that may be causing heavy periods.  Endometrial ablation. This is when various techniques are used to permanently destroy your entire endometrium. After endometrial ablation, most women have little or no menstrual flow. This procedure reduces your ability to become pregnant.  Endometrial resection. In this procedure, an electrosurgical wire loop is used to remove the endometrium. This procedure reduces your ability to become pregnant.  Hysterectomy. This is surgical removal of the uterus. This is a permanent procedure that stops menstrual periods. Pregnancy is not possible after a hysterectomy.  Follow these instructions at home: Medicines  Take over-the-counter and prescription medicines exactly as told by your health care provider. This includes iron pills.  Do not change or switch medicines without asking your health care provider.  Do not take aspirin or medicines that contain aspirin 1 week before or during your menstrual period. Aspirin may make bleeding worse. General instructions  If you need to change your sanitary pad or tampon more than once every 2 hours, limit your activity until the bleeding stops.  Iron pills can cause constipation. To prevent or treat constipation while you are taking prescription iron supplements, your health care provider may recommend that you: ? Drink  enough fluid to keep your urine clear or pale yellow. ? Take over-the-counter or prescription medicines. ? Eat foods that are high in fiber, such as fresh fruits and vegetables, whole grains, and beans. ? Limit foods that are high in fat and processed sugars, such as fried and sweet foods.  Eat well-balanced meals, including foods that are high in iron. Foods that have a lot of iron include leafy green vegetables, meat, liver, eggs, and whole grain breads and cereals.  Do not try to lose weight until the abnormal bleeding has stopped and your blood iron level is back to normal. If you need to lose weight, work with your health care provider to lose weight safely.  Keep all follow-up visits as told by your health care provider. This is important. Contact a health care provider if:  You soak through a pad or tampon every 1 or 2 hours, and this happens every time you have a period.  You need to use pads and tampons at the same time because you are bleeding so much.  You have nausea, vomiting, diarrhea, or other problems related to medicines you are taking. Get help right away if:  You soak through more than a pad or tampon in 1 hour.  You pass clots bigger than 1 inch (2.5 cm) wide.  You feel short of breath.  You feel like your heart is beating too fast.  You feel dizzy or faint.  You feel very weak or tired. Summary  Menorrhagia is a condition in which menstrual periods are heavy or last longer than normal.  Treatment will depend on the cause of the condition and may include medicines or procedures.  Take over-the-counter and prescription medicines exactly as told by your health care provider. This includes iron pills.  Get help right away if you have heavy bleeding that soaks through more than a pad or tampon in 1 hour, you are passing large clots, or you feel dizzy, faint or short of breath. This information is not intended to replace advice given to you by your health care  provider. Make sure you discuss any questions you have with your health care provider. Document Released: 08/16/2005 Document Revised: 08/09/2016 Document Reviewed: 08/09/2016 Elsevier Interactive Patient Education  [employer].

## 2018-04-12 NOTE — Progress Notes (Signed)
  Subjective:     Patient ID: Kelly [name], female   DOB: [birth year], 42 y.o.   MRN: [id number]  HPI  Seen by PCP for fatigue and anemia, along with heavy menses.  The only thing new was the fatigue.  Also reports hair changes, joint pain, and increased napping during daytime. Did have thyroid checked and it was normal.    Onset menarche at age 42. Reports normal monthly menses until 11-12 years ago. Currently monthly menses heavy first 3 days with changing a super plus tampon every 1-2 hours.   Currently using condoms for contraception. Used Nuvaring in past and it worked well except feeling flushed when using it.   G2P2 with normal SVD. No issues with bleeding after delivery. Had exploratory lap and told has endometriosis and IC- both treated. But still having rectal pressure with menses.  Also needs adderall refilled as physician doing that has moved out of area.    Working FT a Librarian, academic for [employer]. Is not exercising. Review of Systems  Constitutional: Positive for fatigue.  HENT: Positive for sinus pressure.   Eyes: Negative.   Respiratory: Negative.   Cardiovascular: Negative.   Gastrointestinal: Negative.   Endocrine: Negative.   Genitourinary: Positive for menstrual problem.  Musculoskeletal: Negative.   Skin: Negative.   Allergic/Immunologic: Negative.   Neurological: Positive for headaches.  Hematological: Bruises/bleeds easily.  Psychiatric/Behavioral: Positive for decreased concentration.       Objective:   Physical Exam A&Ox4 Well groomed female in no distress Blood pressure 120/82, pulse 83, height 5\' 3"  (1.6 m), weight 193 lb 12.8 oz (87.9 kg), last menstrual period 04/02/2018. Body mass index is 34.33 kg/m.  Pelvic exam:deffered in leu of pelvic ultrasound which reveals:     Assessment:     Factor 5 variant Anemia Menorrhagia ADD Obesity     [name]:     Counseled at length on options to treat heavy menses and therefor  treating anemia. Labs obtained and will follow up accordingly. Is open to IUS with progesterone ( doesn't have insurance coverage). Reviewed ultrasound findings today.  Will return with next menses for IUS insertion. adderall refilled till sees new provider in November.    >50 % of 30 minute appointment spent in counseling on above.  Melody Hoback CNM

## 2018-04-17 LAB — COMPREHENSIVE METABOLIC PANEL
A/G RATIO: 1.6 (ref 1.2–2.2)
ALT: 15 IU/L (ref 0–32)
AST: 11 IU/L (ref 0–40)
Albumin: 4.1 g/dL (ref 3.5–5.5)
Alkaline Phosphatase: 79 IU/L (ref 39–117)
BUN/Creatinine Ratio: 15 (ref 9–23)
BUN: 12 mg/dL (ref 6–24)
CHLORIDE: 109 mmol/L — AB (ref 96–106)
CO2: 19 mmol/L — ABNORMAL LOW (ref 20–29)
Calcium: 9.8 mg/dL (ref 8.7–10.2)
Creatinine, Ser: 0.8 mg/dL (ref 0.57–1.00)
GFR calc Af Amer: 106 mL/min/{1.73_m2} (ref >59)
GFR calc non Af Amer: 92 mL/min/{1.73_m2} (ref >59)
GLOBULIN, TOTAL: 2.5 g/dL (ref 1.5–4.5)
Glucose: 90 mg/dL (ref 65–99)
POTASSIUM: 3.8 mmol/L (ref 3.5–5.2)
SODIUM: 141 mmol/L (ref 134–144)
Total Protein: 6.6 g/dL (ref 6.0–8.5)

## 2018-04-17 LAB — CBC WITH DIFFERENTIAL/PLATELET
BASOS: 1 %
Basophils Absolute: 0 10*3/uL (ref 0.0–0.2)
EOS (ABSOLUTE): 0.2 10*3/uL (ref 0.0–0.4)
EOS: 3 %
HEMATOCRIT: 41.4 % (ref 34.0–46.6)
HEMOGLOBIN: 13.2 g/dL (ref 11.1–15.9)
IMMATURE GRANS (ABS): 0 10*3/uL (ref 0.0–0.1)
Immature Granulocytes: 0 %
Lymphocytes Absolute: 1.8 10*3/uL (ref 0.7–3.1)
Lymphs: 35 %
MCH: 25.8 pg — ABNORMAL LOW (ref 26.6–33.0)
MCHC: 31.9 g/dL (ref 31.5–35.7)
MCV: 81 fL (ref 79–97)
Monocytes Absolute: 0.3 10*3/uL (ref 0.1–0.9)
Monocytes: 5 %
NEUTROS ABS: 2.9 10*3/uL (ref 1.4–7.0)
Neutrophils: 56 %
Platelets: 209 10*3/uL (ref 150–450)
RBC: 5.12 x10E6/uL (ref 3.77–5.28)
RDW: 19.5 % — ABNORMAL HIGH (ref 12.3–15.4)
WBC: 5.2 10*3/uL (ref 3.4–10.8)

## 2018-04-17 LAB — B12 AND FOLATE PANEL
Folate: 12.9 ng/mL (ref >3.0)
Vitamin B-12: 554 pg/mL (ref 232–1245)

## 2018-04-17 LAB — THYROID PANEL WITH TSH
Free Thyroxine Index: 1.5 (ref 1.2–4.9)
T3 UPTAKE RATIO: 25 % (ref 24–39)
T4 TOTAL: 6.1 ug/dL (ref 4.5–12.0)
TSH: 2.05 u[IU]/mL (ref 0.450–4.500)

## 2018-04-17 LAB — PT AND PTT
APTT: 29 s (ref 24–33)
INR: 1 (ref 0.8–1.2)
PROTHROMBIN TIME: 10.1 s (ref 9.1–12.0)

## 2018-04-17 LAB — VITAMIN D 25 HYDROXY (VIT D DEFICIENCY, FRACTURES): VIT D 25 HYDROXY: 21.9 ng/mL — AB (ref 30.0–100.0)

## 2018-04-17 LAB — HEMOGLOBIN A1C
ESTIMATED AVERAGE GLUCOSE: 103 mg/dL
Hgb A1c MFr Bld: 5.2 % (ref 4.8–5.6)

## 2018-04-17 LAB — FACTOR 5 LEIDEN

## 2018-04-19 ENCOUNTER — Other Ambulatory Visit: Payer: Self-pay | Admitting: Obstetrics and Gynecology

## 2018-04-19 ENCOUNTER — Telehealth: Payer: Self-pay | Admitting: *Deleted

## 2018-04-19 DIAGNOSIS — D6851 Activated protein C resistance: Secondary | ICD-10-CM

## 2018-04-19 DIAGNOSIS — E559 Vitamin D deficiency, unspecified: Secondary | ICD-10-CM | POA: Insufficient documentation

## 2018-04-19 MED ORDER — VITAMIN D (ERGOCALCIFEROL) 1.25 MG (50000 UNIT) PO CAPS: 50000.0000 [IU] | ORAL_CAPSULE | ORAL | 1 refills | Status: DC

## 2018-04-19 NOTE — Telephone Encounter (Signed)
-----   Message from Joylene Igo, [place] sent at 04/19/2018 11:15 AM EDT ----- Please mail info on vit D dEf

## 2018-04-19 NOTE — Telephone Encounter (Signed)
Mailed vit d info to pt

## 2018-04-28 ENCOUNTER — Ambulatory Visit: Payer: No Typology Code available for payment source | Admitting: Family Medicine

## 2018-05-05 ENCOUNTER — Other Ambulatory Visit: Payer: Self-pay | Admitting: Family Medicine

## 2018-05-09 ENCOUNTER — Ambulatory Visit: Payer: Self-pay | Admitting: Obstetrics and Gynecology

## 2018-05-09 ENCOUNTER — Encounter: Payer: Self-pay | Admitting: Obstetrics and Gynecology

## 2018-05-09 VITALS — BP 122/84 | HR 87 | Ht 63.0 in | Wt 194.0 lb

## 2018-05-09 DIAGNOSIS — N920 Excessive and frequent menstruation with regular cycle: Secondary | ICD-10-CM

## 2018-05-09 DIAGNOSIS — Z30430 Encounter for insertion of intrauterine contraceptive device: Secondary | ICD-10-CM

## 2018-05-09 NOTE — Progress Notes (Signed)
Kelly [name] is a 42 y.o. year old G95P2 Caucasian female who presents for placement of a Mirena IUD.  No LMP recorded. There were no vitals taken for this visit. Last sexual intercourse was 2 weeks ago, and pregnancy test today was negative  The risks and benefits of the method and placement have been thouroughly reviewed with the patient and all questions were answered.  Specifically the patient is aware of failure rate of 08/998, expulsion of the IUD and of possible perforation.  The patient is aware of irregular bleeding due to the method and understands the incidence of irregular bleeding diminishes with time.  Signed copy of informed consent in chart.   Time out was performed.  A graves speculum was placed in the vagina.  The cervix was visualized, prepped using Betadine, and grasped with a single tooth tenaculum. The uterus was found to be neutral and it sounded to 8 cm.  Mirena IUD placed per manufacturer's recommendations.   The strings were trimmed to 3 cm.  The patient was given post procedure instructions, including signs and symptoms of infection and to check for the strings after each menses or each month, and refraining from intercourse or anything in the vagina for 3 days.  She was given a Mirena care card with date Mirena placed, and date Mirena to be removed.    Deisi Salonga [name], CNM

## 2018-05-09 NOTE — Patient Instructions (Signed)

## 2018-06-08 ENCOUNTER — Other Ambulatory Visit: Payer: Self-pay | Admitting: Family Medicine

## 2018-06-12 NOTE — Telephone Encounter (Signed)
Last OV 01/25/2018   Last refilled 06/08/2018 disp 18 g with no refills   Sent to PCP for approval

## 2018-06-13 ENCOUNTER — Other Ambulatory Visit: Payer: Self-pay | Admitting: Family Medicine

## 2018-06-13 MED ORDER — ALBUTEROL SULFATE HFA 108 (90 BASE) MCG/ACT IN AERS: 1.0000 | INHALATION_SPRAY | Freq: Four times a day (QID) | RESPIRATORY_TRACT | 0 refills | Status: DC | PRN

## 2018-06-14 ENCOUNTER — Other Ambulatory Visit: Payer: Self-pay | Admitting: Family Medicine

## 2018-06-14 MED ORDER — SERTRALINE HCL 100 MG PO TABS: 100.0000 mg | ORAL_TABLET | Freq: Every day | ORAL | 0 refills | Status: DC

## 2018-06-15 ENCOUNTER — Encounter: Payer: Self-pay | Admitting: Obstetrics and Gynecology

## 2018-06-15 ENCOUNTER — Ambulatory Visit (INDEPENDENT_AMBULATORY_CARE_PROVIDER_SITE_OTHER): Payer: Self-pay | Admitting: Obstetrics and Gynecology

## 2018-06-15 VITALS — BP 134/88 | HR 98 | Ht 63.0 in | Wt 194.6 lb

## 2018-06-15 DIAGNOSIS — Z6834 Body mass index (BMI) 34.0-34.9, adult: Secondary | ICD-10-CM

## 2018-06-15 DIAGNOSIS — E669 Obesity, unspecified: Secondary | ICD-10-CM

## 2018-06-15 DIAGNOSIS — E559 Vitamin D deficiency, unspecified: Secondary | ICD-10-CM

## 2018-06-15 DIAGNOSIS — Z30431 Encounter for routine checking of intrauterine contraceptive device: Secondary | ICD-10-CM

## 2018-06-15 MED ORDER — CYANOCOBALAMIN 1000 MCG/ML IJ SOLN: 1000.0000 ug | INTRAMUSCULAR | 1 refills | Status: DC

## 2018-06-15 MED ORDER — PHENTERMINE HCL 37.5 MG PO TABS: 37.5000 mg | ORAL_TABLET | Freq: Every day | ORAL | 2 refills | Status: DC

## 2018-06-15 MED ORDER — TOPIRAMATE 50 MG PO TABS: 50.0000 mg | ORAL_TABLET | Freq: Two times a day (BID) | ORAL | 3 refills | Status: DC

## 2018-06-15 MED ORDER — ALBUTEROL SULFATE HFA 108 (90 BASE) MCG/ACT IN AERS: INHALATION_SPRAY | RESPIRATORY_TRACT | 11 refills | Status: DC

## 2018-06-15 MED ORDER — SERTRALINE HCL 100 MG PO TABS: 100.0000 mg | ORAL_TABLET | Freq: Every day | ORAL | 3 refills | Status: DC

## 2018-06-15 NOTE — Progress Notes (Signed)
  Subjective:     Patient ID: Kelly [name], female   DOB: 08/16/1976, 42 y.o.   MRN: [id number]  HPI Here for IUD check up. States she had a light menses last week and no low back pain associated with it. Is very happy so far.  Denies any concerns except wants to start the weight loss program here. Is currently not exercising but plans to start walking.  Also needs some of her routine medications refilled.   Review of Systems  All other systems reviewed and are negative.      Objective:   Physical Exam A&Ox4 Well groomed female in no distress Blood pressure 134/88, pulse 98, height 5\' 3"  (1.6 m), weight 194 lb 9.6 oz (88.3 kg). Body mass index is 34.47 kg/m.  Pelvic exam: normal external genitalia, vulva, vagina, cervix, uterus and adnexa, IUD string noted. .    Assessment:     IUD check Vitamin d deficiency Obesity     [name]:     Labs repeated-will follow up accordingly Will continue with IUD at this times as she is pleased with results so far. Counseled at length on phentermine use and expected outcome of at least 10% weight loss in the next three months.    first B12 injection given and will RTC in 4 weeks for next weight check.    Zaxton Angerer,CNM

## 2018-06-16 ENCOUNTER — Encounter: Payer: Self-pay | Admitting: Obstetrics and Gynecology

## 2018-06-16 LAB — VITAMIN D 25 HYDROXY (VIT D DEFICIENCY, FRACTURES): VIT D 25 HYDROXY: 29.3 ng/mL — AB (ref 30.0–100.0)

## 2018-06-28 DIAGNOSIS — R490 Dysphonia: Secondary | ICD-10-CM | POA: Insufficient documentation

## 2018-06-29 ENCOUNTER — Encounter: Payer: Self-pay | Admitting: Speech Pathology

## 2018-06-29 ENCOUNTER — Ambulatory Visit
Payer: No Typology Code available for payment source | Attending: Unknown Physician Specialty | Admitting: Speech Pathology

## 2018-06-29 ENCOUNTER — Other Ambulatory Visit: Payer: Self-pay

## 2018-06-29 DIAGNOSIS — R490 Dysphonia: Secondary | ICD-10-CM | POA: Diagnosis not present

## 2018-06-29 NOTE — Therapy (Signed)
Kelly [name] [facility] SERVICES [address] [name], [place], [address] Phone: [phone]   Fax:  [phone]  Speech Language Pathology Evaluation  Patient Details  Name: Kelly [name] MRN: [id number] Date of Birth: 01-21-1976 Referring Provider (SLP): Dr. Tami Ribas   Encounter Date: 06/29/2018  End of Session - 06/29/18 1708    Visit Number  1    Number of Visits  9    Date for SLP Re-Evaluation  07/29/18    SLP Start Time  1600    SLP Stop Time   1650    SLP Time Calculation (min)  50 min    Activity Tolerance  Patient tolerated treatment well       Past Medical History:  Diagnosis Date   ADHD    Allergy    Anxiety    Asthma    Fatigue     Past Surgical History:  Procedure Laterality Date   LAMINECTOMY      There were no vitals filed for this visit.      SLP Evaluation [facility] - 06/29/18 0001      SLP Visit Information   SLP Received On  06/29/18    Referring Provider (SLP)  Dr. Tami Ribas    Onset Date  05/08/2018    Medical Diagnosis  Dysphonia      Subjective   Subjective   "It's aggravating!"    Patient/Family Stated Goal  Normal vocal quality      General Information   HPI  Kelly [name] is a 42 y.o. female patient of Kelly Priest, MD for evaluation of voice quality. Approximately 4 months ago, she had some fluctuation from aphonic to dysphonic. No antecedent upper respiratory infection or trauma to the throat. No heavy coughing. She does have asthma. She was taking several kinds of inhalers and she stopped them which did not improve the voice. No trauma to her neck or throat. She does not smoke. She clears her throat frequently and was begun on omeprazole once in the morning recently. She thinks her symptoms are predominantly daytime, not nocturnal. She saw Dr. Tami Ribas at [facility] who felt that her vocal cords may be slightly swollen. He recommended either seeing the voice lab at [facility], or  working with a Electrical engineer. No pain at any time. She is swallowing fine both liquids and solids. What little cough she has is nonproductive. No prior similar occurrences. She is not using her voice vigorously including singing or shouting.      Prior Functional Status   Cognitive/Linguistic Baseline  Within functional limits      Oral Motor/Sensory Function   Overall Oral Motor/Sensory Function  Appears within functional limits for tasks assessed      Motor Speech   Overall Motor Speech  Impaired    Respiration  Impaired    Level of Impairment  Sentence    Phonation  Breathy    Resonance  Within functional limits    Articulation  Within functional limitis    Intelligibility  Intelligible    Phonation  Impaired    Vocal Abuses  Habitual Cough/Throat Clear;Vocal Fold Dehydration;Prolonged Vocal Use    Tension Present  Jaw;Neck;Shoulder    Volume  Soft    Pitch  Low      Standardized Assessments   Standardized Assessments   Other Assessment   Perceptual Voice Evaluation       Perceptual Voice Evaluation Voice checklist:  Health risks: GERD, allergies   Characteristic  voice use: patient states she does talk a lot for her job, sometimes over Art therapist risks: noisy and dry/dusty environmental risks  Misuse: excessive talking, speaking without good breath support  Abuse: throat clearing  Vocal characteristics: breathy, limited voice range, poor vocal projection, excessive pharyngeal resonance Maximum phonation time for sustained ah: 5 seconds Average fundamental frequency during sustained ah: 207 Hz (1.4 STD below average for gender) Habitual pitch: 186 Hz (2.1 STD below average for gender) Highest dynamic pitch when altering pitch from a low note to a high note: 377 Hz Lowest dynamic pitch when altering from a high note to a low note: 173 Hz Highest dynamic pitch in conversational speech: 262 Hz  (with pitch breaks to 773 hz) Lowest dynamic  pitch in conversational speech: 127 Hz Average time patient was able to sustain /s/: 16.7 seconds Average time patient was able to sustain /z/: 6.5 seconds s/z ratio : 2.6 Visi-Pitch: Multi-Dimensional Voice Program (MDVP)  MDVP extracts objective quantitative values (Relative Average Perturbation, Shimmer, Voice Turbulence Index, and Noise to Harmonic Ratio) on sustained phonation, which are displayed graphically and numerically in comparison to a built-in normative database.  The patient exhibited values outside the norm for Relative Average Perturbation and Shimmer.  Average fundamental frequency was 1.4 STD below average for age and gender.  Patient able to improve all parameters with high-pitched e. Education: Patient instructed in extrinsic laryngeal muscle stretches and breath support exercises   SLP Education - 06/29/18 1708    Education Details  Voice and voice therapy    Person(s) Educated  Patient;Parent(s)    Methods  Explanation    Comprehension  Verbalized understanding         SLP Long Term Goals - 06/29/18 1710      SLP LONG TERM GOAL #1   Title  The patient will demonstrate independent understanding of vocal hygiene concepts and extrinsic laryngeal muscle stretches.      Time  4    Period  Weeks    Status  New    Target Date  07/29/18      SLP LONG TERM GOAL #2   Title  The patient will be independent for abdominal breathing and breath support exercises.    Time  4    Period  Weeks    Status  New    Target Date  07/29/18      SLP LONG TERM GOAL #3   Title  The patient will improve tone focus via resonant voice therapy (or comparable technique) with min SLP cues with 80% accuracy.    Time  4    Period  Weeks    Status  New    Target Date  07/29/18      SLP LONG TERM GOAL #4   Title  The patient will maximize voice quality and loudness using breath support/oral resonance for paragraph length recitation with 80% accuracy.    Time  4    Period  Weeks     Status  New    Target Date  07/29/18       Plan - 06/29/18 1709    Clinical Impression Statement  This 43 year old woman under the care of Dr. Tami Ribas, with continued dysphonia after recovery from URI in June, is presenting with moderate dysphonia.  The patient demonstrates breathy vocal quality, reduced breath control for speech, excessive pharyngeal resonance, limited pitch range, and vocal fatigue. She will benefit from voice therapy for education, to improve breath  support, improve tone focus, promote easy flow phonation, and learn techniques to increase loudness and pitch range without strain.    Speech Therapy Frequency  2x / week    Duration  4 weeks    Treatment/Interventions  SLP instruction and feedback;Patient/family education;Other (comment)   Voice therapy   Potential to Achieve Goals  Good    Potential Considerations  Pain level;Ability to learn/carryover information;Family/community support;Co-morbidities;Previous level of function;Cooperation/participation level;Severity of impairments;Medical prognosis    SLP Home Exercise Plan  Provided    Consulted and Agree with Plan of Care  Patient       Patient will benefit from skilled therapeutic intervention in order to improve the following deficits and impairments:   Dysphonia - Plan: SLP plan of care cert/re-cert    Problem List Patient Active Problem List   Diagnosis Date Noted   Vitamin D deficiency 04/19/2018   Factor 5 Leiden mutation, heterozygous ([place]) 04/19/2018   Anemia 01/25/2018   Tension headache 01/25/2018   Rash 01/26/2017   Anxiety 11/23/2016   Asthma 11/23/2016   Obesity (BMI 35.0-39.9 without comorbidity) 11/23/2016   GERD (gastroesophageal reflux disease) 11/23/2016   Kelly Sea, MS/CCC- SLP  Kelly [name] 06/29/2018, 5:14 PM  [place] [name] [facility] SERVICES [address] [place], [place], [address] Phone: [phone]   Fax:   [phone]  Name: Kelly [name] MRN: [id number] Date of Birth: [birth year]

## 2018-07-05 ENCOUNTER — Ambulatory Visit
Payer: No Typology Code available for payment source | Attending: Unknown Physician Specialty | Admitting: Speech Pathology

## 2018-07-05 DIAGNOSIS — R490 Dysphonia: Secondary | ICD-10-CM | POA: Diagnosis present

## 2018-07-06 NOTE — Therapy (Signed)
[place] MAIN [facility] SERVICES [address] [place], [place], [address] Phone: [phone]   Fax:  [phone]  Speech Language Pathology Treatment  Patient Details  Name: Kelly [name] MRN: [id number] Date of Birth: 12/04/1975 Referring Provider (SLP): Dr. Tami Ribas   Encounter Date: 07/05/2018  End of Session - 07/06/18 1126    Visit Number  2    Number of Visits  9    Date for SLP Re-Evaluation  07/29/18    SLP Start Time  1600    SLP Stop Time   1650    SLP Time Calculation (min)  50 min    Activity Tolerance  Patient tolerated treatment well       Past Medical History:  Diagnosis Date  . ADHD   . Allergy   . Anxiety   . Asthma   . Fatigue     Past Surgical History:  Procedure Laterality Date  . LAMINECTOMY      There were no vitals filed for this visit.  Subjective Assessment - 07/06/18 1125    Subjective  "My air gives out"            ADULT SLP TREATMENT - 07/06/18 0001      General Information   Behavior/Cognition  Alert;Cooperative;Pleasant mood    HPI  This 42 year old woman under the care of Dr. Tami Ribas, with continued dysphonia after recovery from URI in June, is presenting with moderate dysphonia.       Treatment Provided   Treatment provided  Cognitive-Linquistic      Pain Assessment   Pain Assessment  No/denies pain      Cognitive-Linquistic Treatment   Treatment focused on  Voice    Skilled Treatment  The patient was provided with written and verbal teaching regarding vocal hygiene.  The patient was provided with written and verbal teaching regarding neck, tongue, and throat stretches exercises to promote relaxed phonation. The patient was provided with written and verbal teaching for supplemental vocal tract relaxation exercises (straw phonation, trill).  The patient was provided with written and verbal teaching regarding breath support exercises.  The patient was provided with verbal, written and  recorded instruction in resonant voice exercises:  Hum- Sustained, Hum- Siren, hum- Vowels, Hum- Descending glides, Hum- Ascending glides, Hum- word level, Fading hum- Reset words.        Assessment / Recommendations / Plan   Plan  Continue with current plan of care      Progression Toward Goals   Progression toward goals  Progressing toward goals       SLP Education - 07/06/18 1126    Education Details  Resonant voice exercises    Person(s) Educated  Patient    Methods  Explanation    Comprehension  Verbalized understanding         SLP Long Term Goals - 06/29/18 1710      SLP LONG TERM GOAL #1   Title  The patient will demonstrate independent understanding of vocal hygiene concepts and extrinsic laryngeal muscle stretches.      Time  4    Period  Weeks    Status  New    Target Date  07/29/18      SLP LONG TERM GOAL #2   Title  The patient will be independent for abdominal breathing and breath support exercises.    Time  4    Period  Weeks    Status  New    Target  Date  07/29/18      SLP LONG TERM GOAL #3   Title  The patient will improve tone focus via resonant voice therapy (or comparable technique) with min SLP cues with 80% accuracy.    Time  4    Period  Weeks    Status  New    Target Date  07/29/18      SLP LONG TERM GOAL #4   Title  The patient will maximize voice quality and loudness using breath support/oral resonance for paragraph length recitation with 80% accuracy.    Time  4    Period  Weeks    Status  New    Target Date  07/29/18       Plan - 07/06/18 1126    Speech Therapy Frequency  2x / week    Duration  4 weeks    Treatment/Interventions  SLP instruction and feedback;Patient/family education;Other (comment)   Voice therapy   Potential to Achieve Goals  Good    Potential Considerations  Pain level;Ability to learn/carryover information;Family/community support;Co-morbidities;Previous level of function;Cooperation/participation level;Severity  of impairments;Medical prognosis    SLP Home Exercise Plan  Provided    Consulted and Agree with Plan of Care  Patient       Patient will benefit from skilled therapeutic intervention in order to improve the following deficits and impairments:   Dysphonia    Problem List Patient Active Problem List   Diagnosis Date Noted  . Vitamin D deficiency 04/19/2018  . Factor 5 Leiden mutation, heterozygous ([place]) 04/19/2018  . Anemia 01/25/2018  . Tension headache 01/25/2018  . Rash 01/26/2017  . Anxiety 11/23/2016  . Asthma 11/23/2016  . Obesity (BMI 35.0-39.9 without comorbidity) 11/23/2016  . GERD (gastroesophageal reflux disease) 11/23/2016   Leroy Sea, MS/CCC- SLP  Lou Miner 07/06/2018, 11:27 AM  [place] MAIN [facility] SERVICES [address] [place], [place], [address] Phone: [phone]   Fax:  [phone]   Name: Kelly [name] MRN: [id number] Date of Birth: December 01, 1975

## 2018-07-11 ENCOUNTER — Ambulatory Visit: Payer: No Typology Code available for payment source | Admitting: Speech Pathology

## 2018-07-11 DIAGNOSIS — R490 Dysphonia: Secondary | ICD-10-CM

## 2018-07-12 ENCOUNTER — Encounter: Payer: Self-pay | Admitting: Speech Pathology

## 2018-07-12 NOTE — Therapy (Signed)
[place] MAIN [facility] SERVICES [address] [place], [place], [address] Phone: [phone]   Fax:  [phone]  Speech Language Pathology Treatment  Patient Details  Name: Kelly [name] MRN: [id number] Date of Birth: [birth year] Referring Provider (SLP): Dr. Tami Ribas   Encounter Date: 07/11/2018  End of Session - 07/12/18 0941    Visit Number  3    Number of Visits  9    Date for SLP Re-Evaluation  07/29/18    SLP Start Time  1500    SLP Stop Time   1600    SLP Time Calculation (min)  60 min    Activity Tolerance  Patient tolerated treatment well       Past Medical History:  Diagnosis Date  . ADHD   . Allergy   . Anxiety   . Asthma   . Fatigue     Past Surgical History:  Procedure Laterality Date  . LAMINECTOMY      There were no vitals filed for this visit.  Subjective Assessment - 07/12/18 0940    Subjective  "My air gives out"            ADULT SLP TREATMENT - 07/12/18 0001      General Information   Behavior/Cognition  Alert;Cooperative;Pleasant mood    HPI  This 42 year old woman under the care of Dr. Tami Ribas, with continued dysphonia after recovery from URI in June, is presenting with moderate dysphonia.       Treatment Provided   Treatment provided  Cognitive-Linquistic      Pain Assessment   Pain Assessment  No/denies pain      Cognitive-Linquistic Treatment   Treatment focused on  Voice    Skilled Treatment  The patient was provided with written and verbal teaching regarding vocal hygiene.  The patient was provided with written and verbal teaching regarding neck, tongue, and throat stretches exercises to promote relaxed phonation. The patient was provided with written and verbal teaching for supplemental vocal tract relaxation exercises (straw phonation, trill).  The patient was provided with written and verbal teaching regarding breath support exercises.  The patient was provided with verbal, written and  recorded instruction in resonant voice exercises:  Hum- Sustained, Hum- Siren, hum- Vowels, Hum- Descending glides, Hum- Ascending glides, Hum- word level, Fading hum- Reset words.  Patient able to generate better quality voice with semi-occluded vocal tract technique- /v/ in isolation, /v/ syllables, /v/ initial words generated with 75% accuracy and into sentences with 75% accuracy for 25 sentences, then patient was not able to find her resonant voice.      Assessment / Recommendations / Plan   Plan  Continue with current plan of care      Progression Toward Goals   Progression toward goals  Progressing toward goals       SLP Education - 07/12/18 0940    Education Details  Semi-occluded vocal tract    Person(s) Educated  Patient    Methods  Explanation    Comprehension  Verbalized understanding         SLP Long Term Goals - 06/29/18 1710      SLP LONG TERM GOAL #1   Title  The patient will demonstrate independent understanding of vocal hygiene concepts and extrinsic laryngeal muscle stretches.      Time  4    Period  Weeks    Status  New    Target Date  07/29/18      SLP  LONG TERM GOAL #2   Title  The patient will be independent for abdominal breathing and breath support exercises.    Time  4    Period  Weeks    Status  New    Target Date  07/29/18      SLP LONG TERM GOAL #3   Title  The patient will improve tone focus via resonant voice therapy (or comparable technique) with min SLP cues with 80% accuracy.    Time  4    Period  Weeks    Status  New    Target Date  07/29/18      SLP LONG TERM GOAL #4   Title  The patient will maximize voice quality and loudness using breath support/oral resonance for paragraph length recitation with 80% accuracy.    Time  4    Period  Weeks    Status  New    Target Date  07/29/18       Plan - 07/12/18 0941    Clinical Impression Statement   Patient able to improve vocal quality with semi-occluded vocal tract to improve oral  resonance and loudness to decrease laryngeal strain.  She is inconsistent and overall, her voice remains tight with excessive pharyngeal resonance.    Speech Therapy Frequency  2x / week    Duration  4 weeks    Treatment/Interventions  SLP instruction and feedback;Patient/family education;Other (comment)   Voice therapy   Potential to Achieve Goals  Good    Potential Considerations  Pain level;Ability to learn/carryover information;Family/community support;Co-morbidities;Previous level of function;Cooperation/participation level;Severity of impairments;Medical prognosis    SLP Home Exercise Plan  Provided    Consulted and Agree with Plan of Care  Patient       Patient will benefit from skilled therapeutic intervention in order to improve the following deficits and impairments:   Dysphonia    Problem List Patient Active Problem List   Diagnosis Date Noted  . Vitamin D deficiency 04/19/2018  . Factor 5 Leiden mutation, heterozygous ([place]) 04/19/2018  . Anemia 01/25/2018  . Tension headache 01/25/2018  . Rash 01/26/2017  . Anxiety 11/23/2016  . Asthma 11/23/2016  . Obesity (BMI 35.0-39.9 without comorbidity) 11/23/2016  . GERD (gastroesophageal reflux disease) 11/23/2016   Leroy Sea, MS/CCC- SLP  Lou Miner 07/12/2018, 9:43 AM  [place] MAIN [facility] SERVICES [address] [place], [place], [address] Phone: [phone]   Fax:  [phone]   Name: Kelly [name] MRN: [id number] Date of Birth: May 28, 1976

## 2018-07-13 ENCOUNTER — Ambulatory Visit: Payer: Self-pay | Admitting: Obstetrics and Gynecology

## 2018-07-13 ENCOUNTER — Encounter: Payer: Self-pay | Admitting: Obstetrics and Gynecology

## 2018-07-13 VITALS — BP 134/91 | HR 79 | Ht 63.0 in | Wt 191.1 lb

## 2018-07-13 DIAGNOSIS — E669 Obesity, unspecified: Secondary | ICD-10-CM

## 2018-07-13 MED ORDER — CYANOCOBALAMIN 1000 MCG/ML IJ SOLN
1000.0000 ug | Freq: Once | INTRAMUSCULAR | Status: AC
  Administered 2018-07-13: 1000 ug via INTRAMUSCULAR

## 2018-07-13 NOTE — Progress Notes (Signed)
Pt is here for wt, bp check, b-12 inj She is doing well, denies any s/e Doesn't feel the medication is working well for her  07/13/18 wt- 191.1lb 06/15/18 wt- 194.1lb  Waist 43 in

## 2018-07-17 ENCOUNTER — Ambulatory Visit: Payer: No Typology Code available for payment source | Admitting: Speech Pathology

## 2018-07-19 ENCOUNTER — Ambulatory Visit: Payer: No Typology Code available for payment source | Admitting: Speech Pathology

## 2018-07-20 ENCOUNTER — Ambulatory Visit: Payer: No Typology Code available for payment source | Admitting: Speech Pathology

## 2018-07-20 DIAGNOSIS — R490 Dysphonia: Secondary | ICD-10-CM

## 2018-07-21 ENCOUNTER — Encounter: Payer: Self-pay | Admitting: Speech Pathology

## 2018-07-21 NOTE — Therapy (Signed)
[place] MAIN [facility] SERVICES [address] [place], [place], [address] Phone: [phone]   Fax:  [phone]  Speech Language Pathology Treatment  Patient Details  Name: GRETE BOSKO MRN: [id number] Date of Birth: 04-12-1976 Referring Provider (SLP): Dr. Tami Ribas   Encounter Date: 07/20/2018  End of Session - 07/21/18 1420    Visit Number  4    Number of Visits  9    Date for SLP Re-Evaluation  07/29/18    SLP Start Time  1600    SLP Stop Time   1650    SLP Time Calculation (min)  50 min    Activity Tolerance  Patient tolerated treatment well       Past Medical History:  Diagnosis Date  . ADHD   . Allergy   . Anxiety   . Asthma   . Fatigue     Past Surgical History:  Procedure Laterality Date  . LAMINECTOMY      There were no vitals filed for this visit.  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 Tami Ribas, with continued dysphonia after recovery from URI in June, is presenting with moderate dysphonia.       Treatment Provided   Treatment provided  Cognitive-Linquistic      Pain Assessment   Pain Assessment  No/denies pain      Cognitive-Linquistic Treatment   Treatment focused on  Voice    Skilled Treatment  The patient was provided with written and verbal teaching regarding vocal hygiene.  The patient was provided with written and verbal teaching regarding neck, tongue, and throat stretches exercises to promote relaxed phonation. The patient was provided with written and verbal teaching for supplemental vocal tract relaxation exercises (straw phonation, trill).  The patient was provided with written and verbal teaching regarding breath support  exercises.  The patient was provided with verbal, written and recorded instruction in resonant voice exercises:  Hum- Sustained, Hum- Siren, hum- Vowels, Hum- Descending glides, Hum- Ascending glides, Hum- word level, Fading hum- Reset words.  Patient able to generate better quality voice with semi-occluded vocal tract technique- /v/ in isolation, /v/ syllables, /v/ initial words generated with 75% accuracy and into sentences with 75% accuracy.  Generate phrases/sentences with clear vocal quality with 75% accuracy.       Assessment / Recommendations / Plan   Plan  Continue with current plan of care      Progression Toward Goals   Progression toward goals  Progressing toward goals       SLP Education - 07/21/18 1419    Education Details  semi-occluded vocal tract    Person(s) Educated  Patient    Methods  Explanation    Comprehension  Verbalized understanding         SLP Long Term Goals - 06/29/18 1710      SLP LONG TERM GOAL #1   Title  The patient will demonstrate independent understanding of vocal hygiene concepts and extrinsic laryngeal muscle stretches.      Time  4    Period  Weeks    Status  New    Target Date  07/29/18  SLP LONG TERM GOAL #2   Title  The patient will be independent for abdominal breathing and breath support exercises.    Time  4    Period  Weeks    Status  New    Target Date  07/29/18      SLP LONG TERM GOAL #3   Title  The patient will improve tone focus via resonant voice therapy (or comparable technique) with min SLP cues with 80% accuracy.    Time  4    Period  Weeks    Status  New    Target Date  07/29/18      SLP LONG TERM GOAL #4   Title  The patient will maximize voice quality and loudness using breath support/oral resonance for paragraph length recitation with 80% accuracy.    Time  4    Period  Weeks    Status  New    Target Date  07/29/18       Plan - 07/21/18 1420    Clinical Impression Statement   Patient able to improve  vocal quality with semi-occluded vocal tract to improve oral resonance and loudness to decrease laryngeal strain.  She is inconsistent and overall, her voice remains tight with excessive pharyngeal resonance.    Speech Therapy Frequency  2x / week    Duration  4 weeks    Potential Considerations  Pain level;Ability to learn/carryover information;Family/community support;Co-morbidities;Previous level of function;Cooperation/participation level;Severity of impairments;Medical prognosis    SLP Home Exercise Plan  Provided    Consulted and Agree with Plan of Care  Patient       Patient will benefit from skilled therapeutic intervention in order to improve the following deficits and impairments:   Dysphonia    Problem List Patient Active Problem List   Diagnosis Date Noted  . Vitamin D deficiency 04/19/2018  . Factor 5 Leiden mutation, heterozygous ([place]) 04/19/2018  . Anemia 01/25/2018  . Tension headache 01/25/2018  . Rash 01/26/2017  . Anxiety 11/23/2016  . Asthma 11/23/2016  . Obesity (BMI 35.0-39.9 without comorbidity) 11/23/2016  . GERD (gastroesophageal reflux disease) 11/23/2016   Leroy Sea, MS/CCC- SLP  Lou Miner 07/21/2018, 2:21 PM  [place] MAIN [facility] SERVICES [address] [place], [place], [address] Phone: [phone]   Fax:  [phone]   Name: JORDIE SKALSKY MRN: [id number] Date of Birth: May 01, 1976

## 2018-07-24 ENCOUNTER — Ambulatory Visit: Payer: No Typology Code available for payment source | Admitting: Speech Pathology

## 2018-07-26 ENCOUNTER — Ambulatory Visit: Payer: No Typology Code available for payment source | Admitting: Speech Pathology

## 2018-07-31 ENCOUNTER — Ambulatory Visit: Payer: No Typology Code available for payment source | Admitting: Speech Pathology

## 2018-08-02 ENCOUNTER — Ambulatory Visit: Payer: No Typology Code available for payment source | Admitting: Speech Pathology

## 2018-08-07 ENCOUNTER — Ambulatory Visit: Payer: No Typology Code available for payment source | Admitting: Speech Pathology

## 2018-08-09 ENCOUNTER — Ambulatory Visit: Payer: No Typology Code available for payment source | Admitting: Speech Pathology

## 2018-09-04 ENCOUNTER — Ambulatory Visit: Payer: No Typology Code available for payment source | Admitting: Family Medicine

## 2018-09-04 ENCOUNTER — Encounter: Payer: Self-pay | Admitting: Family Medicine

## 2018-09-04 VITALS — BP 124/76 | HR 85 | Temp 98.4°F | Ht 63.0 in | Wt 194.0 lb

## 2018-09-04 DIAGNOSIS — R030 Elevated blood-pressure reading, without diagnosis of hypertension: Secondary | ICD-10-CM

## 2018-09-04 DIAGNOSIS — R490 Dysphonia: Secondary | ICD-10-CM | POA: Diagnosis not present

## 2018-09-04 DIAGNOSIS — J4540 Moderate persistent asthma, uncomplicated: Secondary | ICD-10-CM

## 2018-09-04 MED ORDER — FLUTICASONE FUROATE-VILANTEROL 200-25 MCG/INH IN AEPB: 1.0000 | INHALATION_SPRAY | Freq: Every day | RESPIRATORY_TRACT | 5 refills | Status: DC

## 2018-09-04 NOTE — Patient Instructions (Signed)
Monitor BP over the next 2 weeks and keep log of readings

## 2018-09-04 NOTE — Progress Notes (Signed)
Subjective:    Patient ID: Kelly [name], female    DOB: Jan 25, 1976, 43 y.o.   MRN: [id number]  HPI   Patient presents to clinic with episodes of elevated blood pressure.  Patient states off and on for the past few weeks she has had occasional headaches.  When she had these headaches, she had a blood pressure checked and it was in the 170/100 range.  She sat for 15 minutes and blood pressure was rechecked, rechecked less than 150s over 90s.  She monitored her blood pressure readings the next day and continue to get readings in the 150s over 90s.  Patient contacted her GYN asking if possible that the Mirena IUD could be causing this.  GYN assured her that Mirena IUD most likely is not the culprit.  Patient had been taking phentermine for weight loss, but has not been on phentermine in approximately 3 weeks.  Patient also does have Adderall to use if needed to help concentration, but states she does not take this every day, usually will take it a few times a week, maybe less.  Only takes Adderall when she has to go into the office and focus on computer work and paperwork.  Patient also has been feeling more wheezy lately.  She was on different inhalers including Qvar and Air-Duo for helping to control asthma.  However, she started having issues with a hoarse voice so ENT advised her to stop all inhalers except for albuterol to use if needed.  Patient has had 3 scopes to assess vocal cords and investigate hoarse voice, but nothing seems to make any difference.  At her last ENT appointment she was told her vocal cords are no longer swollen, but voice is still hoarse.  Patient states she has been using her albuterol inhaler 3 to 4 times per day every day.   Patient Active Problem List   Diagnosis Date Noted  . Vitamin D deficiency 04/19/2018  . Factor 5 Leiden mutation, heterozygous ([place]) 04/19/2018  . Anemia 01/25/2018  . Tension headache 01/25/2018  . Rash 01/26/2017  . Anxiety 11/23/2016  .  Asthma 11/23/2016  . Obesity (BMI 35.0-39.9 without comorbidity) 11/23/2016  . GERD (gastroesophageal reflux disease) 11/23/2016   Social History   Tobacco Use  . Smoking status: Never Smoker  . Smokeless tobacco: Never Used  Substance Use Topics  . Alcohol use: No   Review of Systems  Constitutional: Negative for chills, fatigue and fever.  HENT: Negative for congestion, ear pain, sinus pain and sore throat.   Eyes: Negative.   Respiratory: +shortness of breath and wheezing.   Cardiovascular: Negative for chest pain, palpitations and leg swelling. +elevated BPs  Gastrointestinal: Negative for abdominal pain, diarrhea, nausea and vomiting.  Genitourinary: Negative for dysuria, frequency and urgency.  Musculoskeletal: Negative for arthralgias and myalgias.  Skin: Negative for color change, pallor and rash.  Neurological: Negative for syncope, light-headedness and headaches.  Psychiatric/Behavioral: The patient is not nervous/anxious.       Objective:   Physical Exam   Constitutional: She appears well-developed and well-nourished. No distress.  HENT:  Head: Normocephalic and atraumatic.  Nose/throat: Normal appearance. Slight hoarse voice quality.  Eyes: Pupils are equal, round, and reactive to light. EOM are normal. No scleral icterus.  Neck: Normal range of motion. Neck supple. No tracheal deviation present.  Cardiovascular: Normal rate, regular rhythm and normal heart sounds. No leg swelling. Pulmonary/Chest: Effort normal and breath sounds normal. No respiratory distress. +expiratory wheezes throughout upper lobes.  She has no rales or rhonchi.  Abdominal: Soft. Bowel sounds are normal. There is no tenderness.  Neurological: She is alert and oriented to person, place, and time.  Gait normal  Skin: Skin is warm and dry. No pallor.  Psychiatric: She has a normal mood and affect. Her behavior is normal. Thought content normal.   Nursing note and vitals reviewed.   Vitals:    09/04/18 1524  BP: 124/76  Pulse: 85  Temp: 98.4 F (36.9 C)  SpO2: 96%   BP Readings from Last 3 Encounters:  09/04/18 124/76  07/13/18 (!) 134/91  06/15/18 134/88      Assessment & [name]:   Elevated BP without diagnosis of hypertension- patient was having elevated readings prior to coming into the office today.  Today her blood pressure looks wonderful at 124/76.  She will monitor her blood pressure over the next 2 weeks at home and keep a log of readings and return to clinic for blood pressure recheck.  Moderate persistent asthma- due to wheezing and use of albuterol inhaler 3-4 times every day, we will trial Breo once per day for asthma control.  Patient aware she can continue to use albuterol with the Brio if needed for wheezing/shortness of breath.  Hoarse voice quality-advised to keep up ENT follow-ups for continued management.  Patient is agreeable to get back on medications for her asthma because stopping the inhalers previously did not have much effect in improving voice hoarseness.  Follow-up here in 2 weeks for recheck of blood pressure and breathing.

## 2018-09-20 ENCOUNTER — Ambulatory Visit: Payer: No Typology Code available for payment source | Admitting: Family Medicine

## 2018-09-20 DIAGNOSIS — Z0289 Encounter for other administrative examinations: Secondary | ICD-10-CM

## 2018-10-30 ENCOUNTER — Encounter: Payer: Self-pay | Admitting: Speech Pathology

## 2018-10-30 ENCOUNTER — Telehealth: Payer: Self-pay

## 2018-10-30 DIAGNOSIS — R490 Dysphonia: Secondary | ICD-10-CM

## 2018-10-30 NOTE — Telephone Encounter (Signed)
Copied from [place] [id number]. Topic: General - Other >> Oct 30, 2018 11:47 AM Oneta Rack wrote: Relation to pt: self  Call back number: [phone]    Reason for call:  Patient requesting referral to Dr. [facility] due to "lost voice".   Patient states she was seen by 2 different ENT's and speech specialist. ENT advised it may be the inhaler, patient states she d/c inhaler and symtopms have not improved. Patient declined seeing PCP first and would like to referral sent to specialist, please advise

## 2018-10-31 NOTE — Telephone Encounter (Signed)
Pt would like a call from Dr. Tharon Aquas nurse about the referral

## 2018-11-01 NOTE — Telephone Encounter (Signed)
Called pt and left a detailed VM that referral was placed to request provider.

## 2018-11-01 NOTE — Telephone Encounter (Signed)
Sent to [facility] please advise once this has been done. Thanks you

## 2018-11-01 NOTE — Telephone Encounter (Signed)
Pt would like a referral to see Antonia B. Jaynee Eagles, MD for a second opinion for horsed voice   [facility]  Pt would like to have this referral placed before PCP returns if possible. Sent to [place] please advise if able or not able to place referral for pt.   Thanks

## 2018-11-01 NOTE — Telephone Encounter (Signed)
It has been sent to [facility] for Dr. Jaynee Eagles.

## 2018-11-01 NOTE — Addendum Note (Signed)
Addended by: Philis Nettle on: 11/01/2018 12:00 PM   Modules accepted: Orders

## 2018-11-03 NOTE — Telephone Encounter (Signed)
Called pt again on cell number and left a detailed VM that referral was placed and she should receive a phone call in 1-2 weeks as to when her appt will be if not advised pt to call our office at [phone]

## 2019-01-09 ENCOUNTER — Other Ambulatory Visit: Payer: Self-pay

## 2019-01-10 ENCOUNTER — Encounter: Payer: Self-pay | Admitting: Family Medicine

## 2019-01-10 ENCOUNTER — Ambulatory Visit (INDEPENDENT_AMBULATORY_CARE_PROVIDER_SITE_OTHER): Payer: No Typology Code available for payment source | Admitting: Family Medicine

## 2019-01-10 ENCOUNTER — Other Ambulatory Visit: Payer: Self-pay

## 2019-01-10 ENCOUNTER — Other Ambulatory Visit: Payer: Self-pay | Admitting: *Deleted

## 2019-01-10 DIAGNOSIS — E559 Vitamin D deficiency, unspecified: Secondary | ICD-10-CM

## 2019-01-10 DIAGNOSIS — F419 Anxiety disorder, unspecified: Secondary | ICD-10-CM | POA: Diagnosis not present

## 2019-01-10 DIAGNOSIS — L659 Nonscarring hair loss, unspecified: Secondary | ICD-10-CM

## 2019-01-10 DIAGNOSIS — D500 Iron deficiency anemia secondary to blood loss (chronic): Secondary | ICD-10-CM

## 2019-01-10 MED ORDER — MINOXIDIL 2 % EX SOLN: Freq: Two times a day (BID) | CUTANEOUS | 2 refills | Status: DC

## 2019-01-10 MED ORDER — AMPHETAMINE-DEXTROAMPHETAMINE 20 MG PO TABS: 20.0000 mg | ORAL_TABLET | ORAL | 0 refills | Status: DC | PRN

## 2019-01-10 NOTE — Progress Notes (Signed)
Patient ID: Kelly [name], female   DOB: Feb 20, 1976, 43 y.o.   MRN: [id number]    Virtual Visit via video Note  This visit type was conducted due to national recommendations for restrictions regarding the COVID-19 pandemic (e.g. social distancing).  This format is felt to be most appropriate for this patient at this time.  All issues noted in this document were discussed and addressed.  No physical exam was performed (except for noted visual exam findings with Video Visits).   I connected with Adline Potter today at 11:00 AM EDT by a video enabled telemedicine application and verified that I am speaking with the correct person using two identifiers. Location patient: home Location provider: [facility] Persons participating in the virtual visit: patient, provider  I discussed the limitations, risks, security and privacy concerns of performing an evaluation and management service by video and the availability of in person appointments. I also discussed with the patient that there may be a patient responsible charge related to this service. The patient expressed understanding and agreed to proceed.  HPI: Patient and I connected via video to discuss hair loss.  Patient has noticed over the past few weeks that her hair seems to be falling out more than usual.  States it is not falling out and big child looks, but feels that the hair that comes out of her head after she brushes her hair and also after washing her hair is more so than usual.  Patient asked her pharmacist could any of the medication she takes potentially contribute to hair loss.  Pharmacist suggested that it is potentially possible the sertraline is contributing.  Patient has been on sertraline for many years, and approximately 2 years ago the dose was increased to 100 mg which is what she currently takes daily.  Patient also has a history of iron deficiency anemia and vitamin D deficiency, so wonders if these could be contributing  to hair loss.  Thyroid panel was checked last year and it was normal.  Patient also speculating about possible autoimmune disease that could very are causing her hair loss.  Patient states she has had a lot of strange things happened over the past year including vocal cord paralysis causing her to have a hoarse voice, the vitamin iron deficiencies and just is concerned that something underlying could be going on that has not yet been discovered.  No fever or chills.  No cough, chest pain, shortness breath or wheezing.  No GI or GU issues.  Tries to eat a balanced diet, lots of fruits and vegetables, lean meats and lots of water.   ROS: See pertinent positives and negatives per HPI.  Past Medical History:  Diagnosis Date  . ADHD   . Allergy   . Anxiety   . Asthma   . Fatigue     Past Surgical History:  Procedure Laterality Date  . LAMINECTOMY      Family History  Problem Relation Age of Onset  . Hypertension Mother   . Non-Hodgkin's lymphoma Father    Social History   Tobacco Use  . Smoking status: Never Smoker  . Smokeless tobacco: Never Used  Substance Use Topics  . Alcohol use: No    Current Outpatient Medications:  .  albuterol (PROVENTIL HFA;VENTOLIN HFA) 108 (90 Base) MCG/ACT inhaler, INHALE 2 PUFFS BY MOUTH INTO THE LUNGS EVERY 6 HOURS AS NEEDED FOR WHEEZING OR SHORTNESS OF BREATH, Disp: 18 g, Rfl: 11 .  amphetamine-dextroamphetamine (ADDERALL) 20 MG tablet, Take  1 tablet (20 mg total) by mouth daily., Disp: 30 tablet, Rfl: 0 .  [START ON 02/10/2019] amphetamine-dextroamphetamine (ADDERALL) 20 MG tablet, Take 1 tablet (20 mg total) by mouth as needed., Disp: 30 tablet, Rfl: 0 .  cyanocobalamin (,VITAMIN B-12,) 1000 MCG/ML injection, Inject 1 mL (1,000 mcg total) into the muscle every 30 (thirty) days., Disp: 10 mL, Rfl: 1 .  fluticasone furoate-vilanterol (BREO ELLIPTA) 200-25 MCG/INH AEPB, Inhale 1 puff into the lungs daily., Disp: 1 each, Rfl: 5 .  levocetirizine  (XYZAL) 5 MG tablet, Take 5 mg by mouth every evening., Disp: , Rfl:  .  levonorgestrel (MIRENA) 20 MCG/24HR IUD, 1 each by Intrauterine route once., Disp: , Rfl:  .  montelukast (SINGULAIR) 10 MG tablet, TAKE 1 TABLET BY MOUTH DAILY AT BEDTIME, Disp: 30 tablet, Rfl: 12 .  omeprazole (PRILOSEC) 40 MG capsule, TAKE 1 CAPSULE BY MOUTH ONCE DAILY, Disp: 90 capsule, Rfl: 3 .  phentermine (ADIPEX-P) 37.5 MG tablet, Take 1 tablet (37.5 mg total) by mouth daily before breakfast., Disp: 30 tablet, Rfl: 2 .  sertraline (ZOLOFT) 100 MG tablet, Take 1 tablet (100 mg total) by mouth daily., Disp: 90 tablet, Rfl: 3 .  topiramate (TOPAMAX) 50 MG tablet, Take 1 tablet (50 mg total) by mouth 2 (two) times daily., Disp: 180 tablet, Rfl: 3 .  Vitamin D, Ergocalciferol, (DRISDOL) 50000 units CAPS capsule, Take 1 capsule (50,000 Units total) by mouth every 7 (seven) days., Disp: 30 capsule, Rfl: 1  EXAM:  VITALS per patient if applicable:  GENERAL: alert, oriented, appears well and in no acute distress  HEENT: atraumatic, conjunttiva clear, no obvious abnormalities on inspection of external nose and ears  NECK: normal movements of the head and neck  LUNGS: on inspection no signs of respiratory distress, breathing rate appears normal, no obvious gross SOB, gasping or wheezing  CV: no obvious cyanosis  SKIN/HAIR: Over the video feed, patient has no obvious large patches of hair loss.  Skin on face appears normal, no rashes or dry patches.  MS: moves all visible extremities without noticeable abnormality  PSYCH/NEURO: pleasant and cooperative, no obvious depression or anxiety, speech and thought processing grossly intact  ASSESSMENT AND PLAN:  Discussed the following assessment and plan:  Hair loss - Plan: minoxidil (MINOXIDIL FOR WOMEN) 2 % external solution, CBC w/Diff, Thyroid Panel With TSH, Comp Met (CMET), Vitamin D (25 hydroxy), B12 and Folate Panel, C-reactive protein, Sedimentation rate,  Antinuclear Antib (ANA)  Iron deficiency anemia due to chronic blood loss - Plan: Iron, TIBC and Ferritin Panel  Anxiety  Vitamin D deficiency  Discussed multiple different reasons for hair loss with patient.  We will do blood work including CBC, thyroid panel, metabolic panel, vitamin D, B12, CRP, sed rate, ANA and iron panel to investigate possible hair loss causes including vitamin and iron deficiencies, electrolyte imbalances, anemias, autoimmune disease.  Patient also interested in decreasing sertraline dose to see if this makes a difference with her hair.  She will cut tablet in half to take 50 mg daily and monitor her progress.  Also discussed with patient that her hair loss could be hereditary female pattern baldness that occurs to women in their 84s and 81s.  We will try minoxidil topical solution (rogaine) twice daily to see if this helps stimulate hair growth.  Also recommended she keep up good fluid intake, eat a balanced diet with lots of fruits and vegetables & lean proteins to help promote overall health.  I discussed the assessment and treatment plan with the patient. The patient was provided an opportunity to ask questions and all were answered. The patient agreed with the plan and demonstrated an understanding of the instructions.   The patient was advised to call back or seek an in-person evaluation if the symptoms worsen or if the condition fails to improve as anticipated.  I provided 25 minutes of video-face-to-face time during this encounter.  Once we have lab work results, we will determine timeframe for next follow-up visit.  Jodelle Green, FNP

## 2019-01-11 ENCOUNTER — Other Ambulatory Visit: Payer: No Typology Code available for payment source

## 2019-01-12 ENCOUNTER — Other Ambulatory Visit: Payer: Self-pay

## 2019-01-12 ENCOUNTER — Other Ambulatory Visit (INDEPENDENT_AMBULATORY_CARE_PROVIDER_SITE_OTHER): Payer: No Typology Code available for payment source

## 2019-01-12 DIAGNOSIS — L659 Nonscarring hair loss, unspecified: Secondary | ICD-10-CM | POA: Diagnosis not present

## 2019-01-12 DIAGNOSIS — D500 Iron deficiency anemia secondary to blood loss (chronic): Secondary | ICD-10-CM

## 2019-01-12 LAB — VITAMIN D 25 HYDROXY (VIT D DEFICIENCY, FRACTURES): VITD: 25.2 ng/mL — ABNORMAL LOW (ref 30.00–100.00)

## 2019-01-12 LAB — CBC WITH DIFFERENTIAL/PLATELET
Basophils Absolute: 0.1 10*3/uL (ref 0.0–0.1)
Basophils Relative: 1.3 % (ref 0.0–3.0)
Eosinophils Absolute: 0.3 10*3/uL (ref 0.0–0.7)
Eosinophils Relative: 4.5 % (ref 0.0–5.0)
HCT: 42.9 % (ref 36.0–46.0)
Hemoglobin: 14.7 g/dL (ref 12.0–15.0)
Lymphocytes Relative: 29.6 % (ref 12.0–46.0)
Lymphs Abs: 1.9 10*3/uL (ref 0.7–4.0)
MCHC: 34.2 g/dL (ref 30.0–36.0)
MCV: 86.5 fl (ref 78.0–100.0)
Monocytes Absolute: 0.4 10*3/uL (ref 0.1–1.0)
Monocytes Relative: 5.4 % (ref 3.0–12.0)
Neutro Abs: 3.9 10*3/uL (ref 1.4–7.7)
Neutrophils Relative %: 59.2 % (ref 43.0–77.0)
Platelets: 218 10*3/uL (ref 150.0–400.0)
RBC: 4.96 Mil/uL (ref 3.87–5.11)
RDW: 13.2 % (ref 11.5–15.5)
WBC: 6.5 10*3/uL (ref 4.0–10.5)

## 2019-01-12 LAB — C-REACTIVE PROTEIN: CRP: <1 mg/dL (ref 0.5–20.0)

## 2019-01-12 LAB — COMPREHENSIVE METABOLIC PANEL
ALT: 16 U/L (ref 0–35)
AST: 12 U/L (ref 0–37)
Albumin: 4.3 g/dL (ref 3.5–5.2)
Alkaline Phosphatase: 85 U/L (ref 39–117)
BUN: 15 mg/dL (ref 6–23)
CO2: 25 mEq/L (ref 19–32)
Calcium: 10.1 mg/dL (ref 8.4–10.5)
Chloride: 109 mEq/L (ref 96–112)
Creatinine, Ser: 0.84 mg/dL (ref 0.40–1.20)
GFR: 74.2 mL/min (ref >60.00)
Glucose, Bld: 96 mg/dL (ref 70–99)
Potassium: 4.3 mEq/L (ref 3.5–5.1)
Sodium: 142 mEq/L (ref 135–145)
Total Bilirubin: 0.3 mg/dL (ref 0.2–1.2)
Total Protein: 6.8 g/dL (ref 6.0–8.3)

## 2019-01-12 LAB — B12 AND FOLATE PANEL
Folate: 8.6 ng/mL (ref >5.9)
Vitamin B-12: 374 pg/mL (ref 211–911)

## 2019-01-12 LAB — SEDIMENTATION RATE: Sed Rate: 11 mm/hr (ref 0–20)

## 2019-01-15 LAB — IRON,TIBC AND FERRITIN PANEL
%SAT: 15 % (calc) — ABNORMAL LOW (ref 16–45)
Ferritin: 17 ng/mL (ref 16–232)
Iron: 54 ug/dL (ref 40–190)
TIBC: 371 mcg/dL (calc) (ref 250–450)

## 2019-01-15 LAB — THYROID PANEL WITH TSH
Free Thyroxine Index: 1.9 (ref 1.4–3.8)
T3 Uptake: 29 % (ref 22–35)
T4, Total: 6.7 ug/dL (ref 5.1–11.9)
TSH: 2.05 mIU/L

## 2019-01-15 LAB — ANA: Anti Nuclear Antibody (ANA): NEGATIVE

## 2019-01-16 ENCOUNTER — Encounter: Payer: Self-pay | Admitting: Family Medicine

## 2019-02-06 ENCOUNTER — Other Ambulatory Visit: Payer: Self-pay | Admitting: Family Medicine

## 2019-03-27 DIAGNOSIS — J383 Other diseases of vocal cords: Secondary | ICD-10-CM | POA: Insufficient documentation

## 2019-05-10 ENCOUNTER — Encounter: Payer: Self-pay | Admitting: Family Medicine

## 2019-05-12 MED ORDER — BUDESONIDE-FORMOTEROL FUMARATE 80-4.5 MCG/ACT IN AERO: 2.0000 | INHALATION_SPRAY | Freq: Two times a day (BID) | RESPIRATORY_TRACT | 3 refills | Status: DC

## 2019-08-22 ENCOUNTER — Other Ambulatory Visit: Payer: Self-pay

## 2019-08-22 MED ORDER — SERTRALINE HCL 100 MG PO TABS: 100.0000 mg | ORAL_TABLET | Freq: Every day | ORAL | 0 refills | Status: DC

## 2019-09-04 ENCOUNTER — Other Ambulatory Visit: Payer: Self-pay

## 2019-09-04 MED ORDER — VITAMIN D (ERGOCALCIFEROL) 1.25 MG (50000 UNIT) PO CAPS: 50000.0000 [IU] | ORAL_CAPSULE | ORAL | 0 refills | Status: DC

## 2019-09-04 NOTE — Telephone Encounter (Signed)
Vit D refill sent per faxed request from [facility].

## 2019-09-11 ENCOUNTER — Other Ambulatory Visit: Payer: Self-pay | Admitting: Certified Nurse Midwife

## 2019-11-02 ENCOUNTER — Ambulatory Visit: Payer: No Typology Code available for payment source | Admitting: Nurse Practitioner

## 2019-11-02 DIAGNOSIS — Z0289 Encounter for other administrative examinations: Secondary | ICD-10-CM

## 2019-11-12 ENCOUNTER — Other Ambulatory Visit: Payer: Self-pay

## 2019-11-12 ENCOUNTER — Ambulatory Visit: Payer: No Typology Code available for payment source | Admitting: Nurse Practitioner

## 2019-11-12 ENCOUNTER — Encounter: Payer: Self-pay | Admitting: Nurse Practitioner

## 2019-11-12 VITALS — BP 130/88 | HR 86 | Temp 97.8°F | Ht 63.0 in | Wt 200.6 lb

## 2019-11-12 DIAGNOSIS — K219 Gastro-esophageal reflux disease without esophagitis: Secondary | ICD-10-CM | POA: Diagnosis not present

## 2019-11-12 DIAGNOSIS — F419 Anxiety disorder, unspecified: Secondary | ICD-10-CM

## 2019-11-12 DIAGNOSIS — R5383 Other fatigue: Secondary | ICD-10-CM

## 2019-11-12 DIAGNOSIS — Z862 Personal history of diseases of the blood and blood-forming organs and certain disorders involving the immune mechanism: Secondary | ICD-10-CM

## 2019-11-12 NOTE — Patient Instructions (Addendum)
Please go to the lab today.  I will place GI referral for GERD and intermittent left upper abd pain on Prilosec for 10 years.   Follow up office visit in 2 -3 weeks to discuss lab results.

## 2019-11-12 NOTE — Progress Notes (Signed)
Established Patient Office Visit  Subjective:  Patient ID: Kelly [name], female    DOB: 1975-11-27  Age: 43 y.o. MRN: [id number]  CC:  Chief Complaint  Patient presents with  . Fatigue   HPI Kelly [name] comes in to establish care with a new provider and to discuss current concerns of fatigue, concern about IDA, wants her thyroid checked, and has a burning stomach despite daily PPI-uninvestigated.   1. She reports fatigue for the last few months. Drained of energy. This reminds her of when her iron was low. However, she is not craving ice or having hair loss like she did last year.  She had mild Covid infection in November, but did not notice any fatigue following that viral illness. She did not have lingering symptoms. She sleeps well, but has had to pull her car over for a 10 min nap. She does not snore. She wakes up sometimes with HA from grinding her teeth and wears a bite guard.  2. Hx of IDA: She had a Hgb 9.6 01/25/2018; hemoccult was negative. LMP- IUD and that helped her menstrual cycle.She was erratic with spotting. Now very light menses and skips some.  She stopped the iron last summer after taking it maybe 6 mos. 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   3. 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 She had a counseling session with a [facility] representative and since just one of her parents had the gene, she was told it is not as serious as if both parents had it.    4. GI: She is also noted post prandial stools. She had burning in stomach that made her thinks about an ulcer. She is on PPI but cannot tolerate NSAIDs. No abdominal pain today.PPI x 10 years- no hx of EGD. FH neg ulcers cc.   5. Anxiety: She is on sertraline 50 mg- one-half of 100 mg. She is doing well. She has more anxiety than depression. She was given Adderall 5 years ago and came off of it. 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 She takes Adderall x2 per week. When she takes it, her energy level is mildly increased. She does not feel like she needs to take it daily.      Never smoked, no ETOH  Health prevention: Mammo is due.   FH neg colon cancer. Father deceased non-Hodgkin lymphoma at 52 yo.  FH in mother and aunt has thyroid issues.    Past Medical History:  Diagnosis Date  . ADHD   . Allergy   . Anxiety   . Asthma   . Fatigue     Past Surgical History:  Procedure Laterality Date  . LAMINECTOMY      Family History  Problem Relation Age of Onset  . Hypertension Mother   . Non-Hodgkin's lymphoma Father     Social History   Socioeconomic History  . Marital status: Married    Spouse name: Not on file  . Number of children: Not on file  . Years of education: Not on file  . Highest education level: Not on file  Occupational History  . Not on file  Tobacco Use  . Smoking status: Never Smoker  . Smokeless tobacco: Never Used  Substance and Sexual Activity  . Alcohol use: No  .  Drug use: No  . Sexual activity: Yes    Birth control/protection: I.U.D.    Comment: mierna  Other Topics Concern  . Not on file  Social History Narrative  . Not on file   Social Determinants of Health   Financial Resource Strain:   . Difficulty of Paying Living Expenses:   Food Insecurity:   . Worried About Charity fundraiser in the Last Year:   . Arboriculturist in the Last Year:   Transportation Needs:   . Film/video editor (Medical):   Marland Kitchen Lack of Transportation (Non-Medical):   Physical Activity:   . Days of Exercise per Week:   . Minutes of Exercise per Session:   Stress:   . Feeling of Stress :   Social Connections:   . Frequency of Communication with Friends and Family:   . Frequency of Social Gatherings with Friends and Family:   . Attends Religious Services:   . Active Member of Clubs or Organizations:   . Attends Archivist  Meetings:   Marland Kitchen Marital Status:   Intimate Partner Violence:   . Fear of Current or Ex-Partner:   . Emotionally Abused:   Marland Kitchen Physically Abused:   . Sexually Abused:     Outpatient Medications Prior to Visit  Medication Sig Dispense Refill  . albuterol (PROVENTIL HFA;VENTOLIN HFA) 108 (90 Base) MCG/ACT inhaler INHALE 2 PUFFS BY MOUTH INTO THE LUNGS EVERY 6 HOURS AS NEEDED FOR WHEEZING OR SHORTNESS OF BREATH 18 g 11  . budesonide-formoterol (SYMBICORT) 80-4.5 MCG/ACT inhaler Inhale 2 puffs into the lungs 2 (two) times daily. 1 Inhaler 3  . levonorgestrel (MIRENA) 20 MCG/24HR IUD 1 each by Intrauterine route once.    . montelukast (SINGULAIR) 10 MG tablet TAKE 1 TABLET BY MOUTH DAILY AT BEDTIME 30 tablet 12  . omeprazole (PRILOSEC) 40 MG capsule TAKE 1 CAPSULE BY MOUTH ONCE DAILY 90 capsule 3  . sertraline (ZOLOFT) 100 MG tablet Take 1 tablet (100 mg total) by mouth daily. 30 tablet 0  . topiramate (TOPAMAX) 50 MG tablet Take 1 tablet (50 mg total) by mouth 2 (two) times daily. 180 tablet 3  . Vitamin D, Ergocalciferol, (DRISDOL) 1.25 MG (50000 UT) CAPS capsule Take 1 capsule (50,000 Units total) by mouth every 7 (seven) days. 12 capsule 0  . amphetamine-dextroamphetamine (ADDERALL) 20 MG tablet Take 1 tablet (20 mg total) by mouth daily. 30 tablet 0  . amphetamine-dextroamphetamine (ADDERALL) 20 MG tablet Take 1 tablet (20 mg total) by mouth as needed. 30 tablet 0  . cyanocobalamin (,VITAMIN B-12,) 1000 MCG/ML injection Inject 1 mL (1,000 mcg total) into the muscle every 30 (thirty) days. 10 mL 1  . levocetirizine (XYZAL) 5 MG tablet Take 5 mg by mouth every evening.    . minoxidil (MINOXIDIL FOR WOMEN) 2 % external solution Apply topically 2 (two) times daily. 60 mL 2  . phentermine (ADIPEX-P) 37.5 MG tablet Take 1 tablet (37.5 mg total) by mouth daily before breakfast. 30 tablet 2   No facility-administered medications prior to visit.    No Known Allergies   Review of Systems    Constitutional: Positive for fatigue.       See HPI  HENT: Positive for voice change. Negative for congestion and sinus pressure.         Vocal cord paralysis:Dr.Mcqueen plans to do surgery implants to pull vocal cords closer together. She has had collagen injections. Stable.   Eyes: Negative.   Respiratory: Negative  for cough and shortness of breath.        Asthma: Symbicort keeps her control 2 puffs in am and rarely has to use Albuterol. Stable.   Cardiovascular: Negative for chest pain and leg swelling.  Gastrointestinal: Positive for abdominal pain. Negative for blood in stool, constipation and diarrhea.       See HPI  Genitourinary: Negative for dysuria.        Interstitial cystitis: ibuprofen x2 per week. Asymptomatic-stable.  Musculoskeletal:       Intermittent back pain:  ibuprofen x2 per week. Asymptomatic-stable.    Allergic/Immunologic:       Allergic rhinitis: She is allergic to cats-by allergy testing and has 2  cats. She has had them for 10 years and cleans a lot. Stable.    Neurological: Negative.   Hematological: Negative for adenopathy. Does not bruise/bleed easily.  Psychiatric/Behavioral:       Anxiety on Zoloft without suicidal or homicidal ideology.     Objective:    Physical Exam  Constitutional: She is oriented to person, place, and time. She appears well-developed and well-nourished.  HENT:  Head: Normocephalic.  Eyes: Pupils are equal, round, and reactive to light.  Cardiovascular: Normal rate and regular rhythm.  Pulmonary/Chest: Effort normal and breath sounds normal.  Abdominal: Soft. There is no abdominal tenderness.  Musculoskeletal:        General: Normal range of motion.     Cervical back: Normal range of motion.  Neurological: She is alert and oriented to person, place, and time.  Skin: Skin is warm and dry.  Psychiatric: She has a normal mood and affect.    BP 130/88   Pulse 86   Temp 97.8 F (36.6 C) (Skin)   Ht 5' 3" (1.6 m)   Wt 200  lb 9.6 oz (91 kg)   SpO2 94%   BMI 35.53 kg/m  Wt Readings from Last 3 Encounters:  11/12/19 200 lb 9.6 oz (91 kg)  09/04/18 194 lb (88 kg)  07/13/18 191 lb 1.6 oz (86.7 kg)     Health Maintenance Due  Topic Date Due  . HIV Screening  Never done  . TETANUS/TDAP  Never done  . INFLUENZA VACCINE  03/31/2019    There are no preventive care reminders to display for this patient.  Lab Results  Component Value Date   TSH 2.44 11/12/2019   Lab Results  Component Value Date   WBC 6.9 11/12/2019   HGB 14.9 11/12/2019   HCT 44.7 11/12/2019   MCV 87.3 11/12/2019   PLT 237.0 11/12/2019   Lab Results  Component Value Date   NA 139 11/12/2019   K 3.6 11/12/2019   CO2 22 11/12/2019   GLUCOSE 87 11/12/2019   BUN 14 11/12/2019   CREATININE 0.76 11/12/2019   BILITOT 0.3 11/12/2019   ALKPHOS 72 11/12/2019   AST 15 11/12/2019   ALT 17 11/12/2019   PROT 7.0 11/12/2019   ALBUMIN 4.1 11/12/2019   CALCIUM 9.3 11/12/2019   GFR 82.96 11/12/2019   No results found for: CHOL No results found for: HDL No results found for: LDLCALC No results found for: TRIG No results found for: CHOLHDL Lab Results  Component Value Date   HGBA1C 5.4 11/12/2019      Assessment & Plan:   Problem List Items Addressed This Visit      Digestive   GERD (gastroesophageal reflux disease)   Relevant Orders   Ambulatory referral to Gastroenterology   H. pylori antibody, IgG (  Completed)     Other   Anxiety    Other Visit Diagnoses    Fatigue, unspecified type    -  Primary   Relevant Orders   Comp Met (CMET) (Completed)   HgB A1c (Completed)   TSH (Completed)   Celiac Disease Ab Screen w/Rfx (Completed)   History of anemia       Relevant Orders   CBC (Completed)   Celiac Disease Ab Screen w/Rfx (Completed)     1. Fatigue: Maybe multifactorial. Check labs , sleep study to be considered if labs WNL.  2. Hx of IDA- no longer having heavy menses 3. I will place GI referral for GERD and  intermittent left upper abd pain on Prilosec for 10 years.   Follow up office visit in 2 -3 weeks to discuss lab results and preventative care.   Follow-up: Return in about 3 weeks (around 12/03/2019).   This visit occurred during the SARS-CoV-2 public health emergency.  Safety protocols were in place, including screening questions prior to the visit, additional usage of staff PPE, and extensive cleaning of exam room while observing appropriate contact time as indicated for disinfecting solutions.   Denice Paradise, NP

## 2019-11-13 LAB — CELIAC DISEASE AB SCREEN W/RFX
Antigliadin Abs, IgA: 5 units (ref 0–19)
IgA/Immunoglobulin A, Serum: 240 mg/dL (ref 87–352)
Transglutaminase IgA: <2 U/mL (ref 0–3)

## 2019-11-13 LAB — COMPREHENSIVE METABOLIC PANEL
ALT: 17 U/L (ref 0–35)
AST: 15 U/L (ref 0–37)
Albumin: 4.1 g/dL (ref 3.5–5.2)
Alkaline Phosphatase: 72 U/L (ref 39–117)
BUN: 14 mg/dL (ref 6–23)
CO2: 22 mEq/L (ref 19–32)
Calcium: 9.3 mg/dL (ref 8.4–10.5)
Chloride: 109 mEq/L (ref 96–112)
Creatinine, Ser: 0.76 mg/dL (ref 0.40–1.20)
GFR: 82.96 mL/min (ref >60.00)
Glucose, Bld: 87 mg/dL (ref 70–99)
Potassium: 3.6 mEq/L (ref 3.5–5.1)
Sodium: 139 mEq/L (ref 135–145)
Total Bilirubin: 0.3 mg/dL (ref 0.2–1.2)
Total Protein: 7 g/dL (ref 6.0–8.3)

## 2019-11-13 LAB — CBC
HCT: 44.7 % (ref 36.0–46.0)
Hemoglobin: 14.9 g/dL (ref 12.0–15.0)
MCHC: 33.3 g/dL (ref 30.0–36.0)
MCV: 87.3 fl (ref 78.0–100.0)
Platelets: 237 10*3/uL (ref 150.0–400.0)
RBC: 5.11 Mil/uL (ref 3.87–5.11)
RDW: 12.8 % (ref 11.5–15.5)
WBC: 6.9 10*3/uL (ref 4.0–10.5)

## 2019-11-13 LAB — H. PYLORI ANTIBODY, IGG: H Pylori IgG: NEGATIVE

## 2019-11-13 LAB — HEMOGLOBIN A1C: Hgb A1c MFr Bld: 5.4 % (ref 4.6–6.5)

## 2019-11-13 LAB — TSH: TSH: 2.44 u[IU]/mL (ref 0.35–4.50)

## 2019-11-14 ENCOUNTER — Encounter: Payer: Self-pay | Admitting: Nurse Practitioner

## 2019-11-16 ENCOUNTER — Ambulatory Visit: Payer: No Typology Code available for payment source

## 2019-11-20 ENCOUNTER — Encounter: Payer: Self-pay | Admitting: Gastroenterology

## 2019-11-20 ENCOUNTER — Ambulatory Visit (INDEPENDENT_AMBULATORY_CARE_PROVIDER_SITE_OTHER): Payer: No Typology Code available for payment source | Admitting: Gastroenterology

## 2019-11-20 VITALS — BP 110/78 | HR 76 | Temp 97.9°F | Ht 63.0 in | Wt 198.6 lb

## 2019-11-20 DIAGNOSIS — R1013 Epigastric pain: Secondary | ICD-10-CM | POA: Diagnosis not present

## 2019-11-20 DIAGNOSIS — R194 Change in bowel habit: Secondary | ICD-10-CM | POA: Diagnosis not present

## 2019-11-20 DIAGNOSIS — Z01818 Encounter for other preprocedural examination: Secondary | ICD-10-CM

## 2019-11-20 MED ORDER — NA SULFATE-K SULFATE-MG SULF 17.5-3.13-1.6 GM/177ML PO SOLN: 1.0000 | Freq: Once | ORAL | 0 refills | Status: DC

## 2019-11-20 MED ORDER — DICYCLOMINE HCL 10 MG PO CAPS: 10.0000 mg | ORAL_CAPSULE | Freq: Three times a day (TID) | ORAL | 11 refills | Status: DC

## 2019-11-20 NOTE — Patient Instructions (Signed)
We have sent the following medications to your pharmacy for you to pick up at your convenience: dicyclomine.   You have been scheduled for an endoscopy and colonoscopy. Please follow the written instructions given to you at your visit today. Please pick up your prep supplies at the pharmacy within the next 1-3 days. If you use inhalers (even only as needed), please bring them with you on the day of your procedure.  Normal BMI (Body Mass Index- based on height and weight) is between 19 and 25. Your BMI today is Body mass index is 35.18 kg/m. Marland Kitchen Please consider follow up  regarding your BMI with your Primary Care Provider.  Thank you for choosing me and [facility].  Pricilla Riffle. Dagoberto Ligas., MD., [name]

## 2019-11-20 NOTE — Progress Notes (Signed)
History of Present Illness: This is a 44 year old female referred by [name], MD for the evaluation of epigastric burning, bloating, frequent bowel movements post meals.  Frequent postprandial bowel movements have been present for about 1 month.  Other symptoms present for about 2 months.  She has been taking omeprazole 40 mg daily and frequently taking Pepto-Bismol which has provided some relief.  Evaluation by her PCP included CBC, CMP, TSH, celiac antibodies, H. pylori IgG all were normal or negative.  She has a history of iron deficiency anemia in 2019 attributed to AUB and after placement of an IUD her menstrual cycles are normal she is no longer anemic.  She has a history of GERD with LPR symptoms which were previously controlled on omeprazole.  She also has a history of vocal cord paralysis evaluated at [facility]. Denies weight loss, constipation, change in stool caliber, melena, hematochezia, nausea, vomiting, dysphagia, chest pain.    No Known Allergies Outpatient Medications Prior to Visit  Medication Sig Dispense Refill  . albuterol (PROVENTIL HFA;VENTOLIN HFA) 108 (90 Base) MCG/ACT inhaler INHALE 2 PUFFS BY MOUTH INTO THE LUNGS EVERY 6 HOURS AS NEEDED FOR WHEEZING OR SHORTNESS OF BREATH 18 g 11  . budesonide-formoterol (SYMBICORT) 80-4.5 MCG/ACT inhaler Inhale 2 puffs into the lungs 2 (two) times daily. 1 Inhaler 3  . levonorgestrel (MIRENA) 20 MCG/24HR IUD 1 each by Intrauterine route once.    . montelukast (SINGULAIR) 10 MG tablet TAKE 1 TABLET BY MOUTH DAILY AT BEDTIME 30 tablet 12  . omeprazole (PRILOSEC) 40 MG capsule TAKE 1 CAPSULE BY MOUTH ONCE DAILY 90 capsule 3  . sertraline (ZOLOFT) 100 MG tablet Take 1 tablet (100 mg total) by mouth daily. 30 tablet 0  . topiramate (TOPAMAX) 50 MG tablet Take 1 tablet (50 mg total) by mouth 2 (two) times daily. 180 tablet 3  . Vitamin D, Ergocalciferol, (DRISDOL) 1.25 MG (50000 UT) CAPS capsule Take 1 capsule (50,000 Units total) by  mouth every 7 (seven) days. 12 capsule 0   No facility-administered medications prior to visit.   Past Medical History:  Diagnosis Date  . ADHD   . Allergy   . Anxiety   . Asthma   . Fatigue    Past Surgical History:  Procedure Laterality Date  . LAMINECTOMY     Social History   Socioeconomic History  . Marital status: Married    Spouse name: Not on file  . Number of children: Not on file  . Years of education: Not on file  . Highest education level: Not on file  Occupational History  . Not on file  Tobacco Use  . Smoking status: Never Smoker  . Smokeless tobacco: Never Used  Substance and Sexual Activity  . Alcohol use: No  . Drug use: No  . Sexual activity: Yes    Birth control/protection: I.U.D.    Comment: mierna  Other Topics Concern  . Not on file  Social History Narrative  . Not on file   Social Determinants of Health   Financial Resource Strain:   . Difficulty of Paying Living Expenses:   Food Insecurity:   . Worried About Charity fundraiser in the Last Year:   . Arboriculturist in the Last Year:   Transportation Needs:   . Film/video editor (Medical):   Marland Kitchen Lack of Transportation (Non-Medical):   Physical Activity:   . Days of Exercise per Week:   . Minutes of Exercise  per Session:   Stress:   . Feeling of Stress :   Social Connections:   . Frequency of Communication with Friends and Family:   . Frequency of Social Gatherings with Friends and Family:   . Attends Religious Services:   . Active Member of Clubs or Organizations:   . Attends Archivist Meetings:   Marland Kitchen Marital Status:    Family History  Problem Relation Age of Onset  . Hypertension Mother   . Non-Hodgkin's lymphoma Father   . Lymphoma Father   . Cancer Father   . Cancer - Cervical Father   . Colon polyps Neg Hx   . Colon cancer Neg Hx       Review of Systems: Pertinent positive and negative review of systems were noted in the above HPI section. All other  review of systems were otherwise negative.   Physical Exam: General: Well developed, well nourished, no acute distress Head: Normocephalic and atraumatic Eyes:  sclerae anicteric, EOMI Ears: Normal auditory acuity Mouth: Not examined, mask on during Covid-19 pandemic Neck: Supple, no masses or thyromegaly Lungs: Clear throughout to auscultation Heart: Regular rate and rhythm; no murmurs, rubs or bruits Abdomen: Soft, mild epigastric tenderness and non distended. No masses, hepatosplenomegaly or hernias noted. Normal Bowel sounds Rectal: Deferred to colonoscopy Musculoskeletal: Symmetrical with no gross deformities  Skin: No lesions on visible extremities Pulses:  Normal pulses noted Extremities: No clubbing, cyanosis, edema or deformities noted Neurological: Alert oriented x 4, grossly nonfocal Cervical Nodes:  No significant cervical adenopathy Inguinal Nodes: No significant inguinal adenopathy Psychological:  Alert and cooperative. Anxious   Assessment and Recommendations:  1.  Burning epigastric pain, GERD symptoms, bloating, frequent postprandial bowel movements, change in bowel habits.  History of iron deficiency anemia attributed to AUB in 2019.  Rule out IBD, colorectal neoplasms, ulcer, esophagitis, IBS.  Follow antireflux measures.  Begin dicyclomine 10 mg 3 times daily AC. Continue omeprazole 40 mg daily.  Discontinue Pepto-Bismol.  Schedule colonoscopy and EGD. The risks (including bleeding, perforation, infection, missed lesions, medication reactions and possible hospitalization or surgery if complications occur), benefits, and alternatives to endoscopy with possible biopsy and possible dilation were discussed with the patient and they consent to proceed. The risks (including bleeding, perforation, infection, missed lesions, medication reactions and possible hospitalization or surgery if complications occur), benefits, and alternatives to colonoscopy with possible biopsy and  possible polypectomy were discussed with the patient and they consent to proceed.    cc: [name], MD [place] [place],  [place] [id number]

## 2019-12-04 ENCOUNTER — Ambulatory Visit: Payer: No Typology Code available for payment source | Admitting: Nurse Practitioner

## 2019-12-19 ENCOUNTER — Ambulatory Visit (INDEPENDENT_AMBULATORY_CARE_PROVIDER_SITE_OTHER): Payer: No Typology Code available for payment source

## 2019-12-19 ENCOUNTER — Other Ambulatory Visit: Payer: Self-pay | Admitting: Gastroenterology

## 2019-12-19 DIAGNOSIS — Z1159 Encounter for screening for other viral diseases: Secondary | ICD-10-CM

## 2019-12-20 ENCOUNTER — Other Ambulatory Visit: Payer: Self-pay | Admitting: Family Medicine

## 2019-12-20 DIAGNOSIS — Z1231 Encounter for screening mammogram for malignant neoplasm of breast: Secondary | ICD-10-CM

## 2019-12-20 LAB — SARS CORONAVIRUS 2 (TAT 6-24 HRS): SARS Coronavirus 2: NEGATIVE

## 2019-12-21 ENCOUNTER — Other Ambulatory Visit: Payer: Self-pay

## 2019-12-21 ENCOUNTER — Ambulatory Visit (AMBULATORY_SURGERY_CENTER): Payer: No Typology Code available for payment source | Admitting: Gastroenterology

## 2019-12-21 ENCOUNTER — Encounter: Payer: Self-pay | Admitting: Gastroenterology

## 2019-12-21 VITALS — BP 118/74 | HR 70 | Temp 96.9°F | Resp 17 | Ht 63.0 in | Wt 198.0 lb

## 2019-12-21 DIAGNOSIS — R194 Change in bowel habit: Secondary | ICD-10-CM

## 2019-12-21 DIAGNOSIS — R1013 Epigastric pain: Secondary | ICD-10-CM

## 2019-12-21 DIAGNOSIS — K219 Gastro-esophageal reflux disease without esophagitis: Secondary | ICD-10-CM

## 2019-12-21 DIAGNOSIS — K317 Polyp of stomach and duodenum: Secondary | ICD-10-CM

## 2019-12-21 MED ORDER — SODIUM CHLORIDE 0.9 % IV SOLN: 500.0000 mL | Freq: Once | INTRAVENOUS | Status: DC

## 2019-12-21 NOTE — Progress Notes (Signed)
To PACU, VSS. Report to [email or web address] 

## 2019-12-21 NOTE — Patient Instructions (Signed)
Follow antireflux measures   YOU HAD AN ENDOSCOPIC PROCEDURE TODAY AT [facility]:   Refer to the procedure report that was given to you for any specific questions about what was found during the examination.  If the procedure report does not answer your questions, please call your gastroenterologist to clarify.  If you requested that your care partner not be given the details of your procedure findings, then the procedure report has been included in a sealed envelope for you to review at your convenience later.  YOU SHOULD EXPECT: Some feelings of bloating in the abdomen. Passage of more gas than usual.  Walking can help get rid of the air that was put into your GI tract during the procedure and reduce the bloating. If you had a lower endoscopy (such as a colonoscopy or flexible sigmoidoscopy) you may notice spotting of blood in your stool or on the toilet paper. If you underwent a bowel prep for your procedure, you may not have a normal bowel movement for a few days.  Please Note:  You might notice some irritation and congestion in your nose or some drainage.  This is from the oxygen used during your procedure.  There is no need for concern and it should clear up in a day or so.  SYMPTOMS TO REPORT IMMEDIATELY:   Following lower endoscopy (colonoscopy or flexible sigmoidoscopy):  Excessive amounts of blood in the stool  Significant tenderness or worsening of abdominal pains  Swelling of the abdomen that is new, acute  Fever of 100F or higher   Following upper endoscopy (EGD)  Vomiting of blood or coffee ground material  New chest pain or pain under the shoulder blades  Painful or persistently difficult swallowing  New shortness of breath  Fever of 100F or higher  Black, tarry-looking stools  For urgent or emergent issues, a gastroenterologist can be reached at any hour by calling [phone]. Do not use MyChart messaging for urgent concerns.    DIET:  We do  recommend a small meal at first, but then you may proceed to your regular diet.  Drink plenty of fluids but you should avoid alcoholic beverages for 24 hours.  ACTIVITY:  You should plan to take it easy for the rest of today and you should NOT DRIVE or use heavy machinery until tomorrow (because of the sedation medicines used during the test).    FOLLOW UP: Our staff will call the number listed on your records 48-72 hours following your procedure to check on you and address any questions or concerns that you may have regarding the information given to you following your procedure. If we do not reach you, we will leave a message.  We will attempt to reach you two times.  During this call, we will ask if you have developed any symptoms of COVID 19. If you develop any symptoms (ie: fever, flu-like symptoms, shortness of breath, cough etc.) before then, please call [phone].  If you test positive for Covid 19 in the 2 weeks post procedure, please call and report this information to [place].    If any biopsies were taken you will be contacted by phone or by letter within the next 1-3 weeks.  Please call us at [phone] if you have not heard about the biopsies in 3 weeks.    SIGNATURES/CONFIDENTIALITY: You and/or your care partner have signed paperwork which will be entered into your electronic medical record.  These signatures attest to the fact that  that the information above on your After Visit Summary has been reviewed and is understood.  Full responsibility of the confidentiality of this discharge information lies with you and/or your care-partner.

## 2019-12-21 NOTE — Op Note (Signed)
[facility] Patient Name: Kelly [name] Procedure Date: 12/21/2019 2:09 PM MRN: [id number] Endoscopist: Ladene Artist , MD Age: 44 Referring MD:  Date of Birth: [birth year] Gender: Female Account #: [id number] Procedure:                Upper GI endoscopy Indications:              Epigastric abdominal pain, Suspected                            gastroesophageal reflux disease Medicines:                Monitored Anesthesia Care Procedure:                Pre-Anesthesia Assessment:                           - Prior to the procedure, a History and Physical                            was performed, and patient medications and                            allergies were reviewed. The patient's tolerance of                            previous anesthesia was also reviewed. The risks                            and benefits of the procedure and the sedation                            options and risks were discussed with the patient.                            All questions were answered, and informed consent                            was obtained. Prior Anticoagulants: The patient has                            taken no previous anticoagulant or antiplatelet                            agents. ASA Grade Assessment: II - A patient with                            mild systemic disease. After reviewing the risks                            and benefits, the patient was deemed in                            satisfactory condition to undergo the procedure.  After obtaining informed consent, the endoscope was                            passed under direct vision. Throughout the                            procedure, the patient's blood pressure, pulse, and                            oxygen saturations were monitored continuously. The                            Endoscope was introduced through the mouth, and                            advanced to the second part of  duodenum. The upper                            GI endoscopy was accomplished without difficulty.                            The patient tolerated the procedure well. Scope In: Scope Out: Findings:                 The examined esophagus was normal.                           A few small sessile polyps with no bleeding and no                            stigmata of recent bleeding were found in the                            gastric body. Biopsies were taken with a cold                            forceps for histology.                           The exam of the stomach was otherwise normal.                           The duodenal bulb and second portion of the                            duodenum were normal. Complications:            No immediate complications. Estimated Blood Loss:     Estimated blood loss was minimal. Impression:               - Normal esophagus.                           - A few gastric polyps. Biopsied.                           -  Normal duodenal bulb and second portion of the                            duodenum. Recommendation:           - Patient has a contact number available for                            emergencies. The signs and symptoms of potential                            delayed complications were discussed with the                            patient. Return to normal activities tomorrow.                            Written discharge instructions were provided to the                            patient.                           - Resume previous diet.                           - Antireflux measures.                           - Continue present medications.                           - Await pathology results. Ladene Artist, MD 12/21/2019 2:44:27 PM This report has been signed electronically.

## 2019-12-21 NOTE — Progress Notes (Signed)
Called to room to assist during endoscopic procedure.  Patient ID and intended procedure confirmed with present staff. Received instructions for my participation in the procedure from the performing physician.  

## 2019-12-21 NOTE — Progress Notes (Signed)
Vitals-DT Temp-JB  History reviewed. 

## 2019-12-21 NOTE — Op Note (Signed)
[facility] Patient Name: Kelly [name] Procedure Date: 12/21/2019 2:09 PM MRN: [id number] Endoscopist: Ladene Artist , MD Age: 44 Referring MD:  Date of Birth: April 25, 1976 Gender: Female Account #: [id number] Procedure:                Colonoscopy Indications:              Change in bowel habits Medicines:                Monitored Anesthesia Care Procedure:                Pre-Anesthesia Assessment:                           - Prior to the procedure, a History and Physical                            was performed, and patient medications and                            allergies were reviewed. The patient's tolerance of                            previous anesthesia was also reviewed. The risks                            and benefits of the procedure and the sedation                            options and risks were discussed with the patient.                            All questions were answered, and informed consent                            was obtained. Prior Anticoagulants: The patient has                            taken no previous anticoagulant or antiplatelet                            agents. ASA Grade Assessment: II - A patient with                            mild systemic disease. After reviewing the risks                            and benefits, the patient was deemed in                            satisfactory condition to undergo the procedure.                           After obtaining informed consent, the colonoscope  was passed under direct vision. Throughout the                            procedure, the patient's blood pressure, pulse, and                            oxygen saturations were monitored continuously. The                            Colonoscope was introduced through the anus and                            advanced to the the cecum, identified by                            appendiceal orifice and ileocecal valve. The                             ileocecal valve, appendiceal orifice, and rectum                            were photographed. The quality of the bowel                            preparation was good. The colonoscopy was performed                            without difficulty. The patient tolerated the                            procedure well. Scope In: 2:15:37 PM Scope Out: 2:29:43 PM Scope Withdrawal Time: 0 hours 10 minutes 43 seconds  Total Procedure Duration: 0 hours 14 minutes 6 seconds  Findings:                 The perianal and digital rectal examinations were                            normal.                           The entire examined colon appeared normal on direct                            and retroflexion views. Complications:            No immediate complications. Estimated blood loss:                            None. Estimated Blood Loss:     Estimated blood loss: none. Impression:               - The entire examined colon is normal on direct and                            retroflexion views.                           -  No specimens collected. Recommendation:           - Repeat colonoscopy in 10 years for screening                            purposes.                           - Patient has a contact number available for                            emergencies. The signs and symptoms of potential                            delayed complications were discussed with the                            patient. Return to normal activities tomorrow.                            Written discharge instructions were provided to the                            patient.                           - Resume previous diet.                           - Continue present medications. Ladene Artist, MD 12/21/2019 2:41:10 PM This report has been signed electronically.

## 2019-12-25 ENCOUNTER — Telehealth: Payer: Self-pay

## 2019-12-25 ENCOUNTER — Telehealth: Payer: Self-pay | Admitting: *Deleted

## 2019-12-25 NOTE — Telephone Encounter (Signed)
No answer for post procedure call back. Left message for patient to call with questions or concerns and will attempt to call  Back later this afternoon.

## 2019-12-25 NOTE — Telephone Encounter (Signed)
No answer, left message to call if having any issues or concerns, B.Avyon Herendeen RN 

## 2019-12-26 ENCOUNTER — Other Ambulatory Visit: Payer: Self-pay | Admitting: Certified Nurse Midwife

## 2020-01-07 ENCOUNTER — Encounter: Payer: Self-pay | Admitting: Gastroenterology

## 2020-01-08 ENCOUNTER — Other Ambulatory Visit: Payer: Self-pay

## 2020-01-08 MED ORDER — SERTRALINE HCL 100 MG PO TABS: 100.0000 mg | ORAL_TABLET | Freq: Every day | ORAL | 0 refills | Status: DC

## 2020-01-11 ENCOUNTER — Encounter: Payer: Self-pay | Admitting: Certified Nurse Midwife

## 2020-01-11 ENCOUNTER — Ambulatory Visit (INDEPENDENT_AMBULATORY_CARE_PROVIDER_SITE_OTHER): Payer: No Typology Code available for payment source | Admitting: Certified Nurse Midwife

## 2020-01-11 ENCOUNTER — Other Ambulatory Visit: Payer: Self-pay

## 2020-01-11 VITALS — BP 132/91 | HR 71 | Ht 63.0 in | Wt 199.8 lb

## 2020-01-11 DIAGNOSIS — Z01419 Encounter for gynecological examination (general) (routine) without abnormal findings: Secondary | ICD-10-CM

## 2020-01-11 DIAGNOSIS — F988 Other specified behavioral and emotional disorders with onset usually occurring in childhood and adolescence: Secondary | ICD-10-CM

## 2020-01-11 DIAGNOSIS — Z975 Presence of (intrauterine) contraceptive device: Secondary | ICD-10-CM

## 2020-01-11 DIAGNOSIS — E559 Vitamin D deficiency, unspecified: Secondary | ICD-10-CM

## 2020-01-11 DIAGNOSIS — F419 Anxiety disorder, unspecified: Secondary | ICD-10-CM

## 2020-01-11 NOTE — Patient Instructions (Addendum)
Vitamin D Deficiency Vitamin D deficiency is when your body does not have enough vitamin D. Vitamin D is important to your body for many reasons:  It helps the body absorb two important minerals--calcium and phosphorus.  It plays a role in bone health.  It may help to prevent some diseases, such as diabetes and multiple sclerosis.  It plays a role in muscle function, including heart function. If vitamin D deficiency is severe, it can cause a condition in which your bones become soft. In adults, this condition is called osteomalacia. In children, this condition is called rickets. What are the causes? This condition may be caused by:  Not eating enough foods that contain vitamin D.  Not getting enough natural sun exposure.  Having certain digestive system diseases that make it difficult for your body to absorb vitamin D. These diseases include Crohn's disease, chronic pancreatitis, and cystic fibrosis.  Having a surgery in which a part of the stomach or a part of the small intestine is removed.  Having chronic kidney disease or liver disease. What increases the risk? You are more likely to develop this condition if you:  Are older.  Do not spend much time outdoors.  Live in a long-term care facility.  Have had broken bones.  Have weak or thin bones (osteoporosis).  Have a disease or condition that changes how the body absorbs vitamin D.  Have dark skin.  Take certain medicines, such as steroid medicines or certain seizure medicines.  Are overweight or obese. What are the signs or symptoms? In mild cases of vitamin D deficiency, there may not be any symptoms. If the condition is severe, symptoms may include:  Bone pain.  Muscle pain.  Falling often.  Broken bones caused by a minor injury. How is this diagnosed? This condition may be diagnosed with blood tests. Imaging tests such as X-rays may also be done to look for changes in the bone. How is this  treated? Treatment for this condition may depend on what caused the condition. Treatment options include:  Taking vitamin D supplements. Your health care provider will suggest what dose is best for you.  Taking a calcium supplement. Your health care provider will suggest what dose is best for you. Follow these instructions at home: Eating and drinking   Eat foods that contain vitamin D. Choices include: ? Fortified dairy products, cereals, or juices. Fortified means that vitamin D has been added to the food. Check the label on the package to see if the food is fortified. ? Fatty fish, such as salmon or trout. ? Eggs. ? Oysters. ? Mushrooms. The items listed above may not be a complete list of recommended foods and beverages. Contact a dietitian for more information. General instructions  Take medicines and supplements only as told by your health care provider.  Get regular, safe exposure to natural sunlight.  Do not use a tanning bed.  Maintain a healthy weight. Lose weight if needed.  Keep all follow-up visits as told by your health care provider. This is important. How is this prevented? You can get vitamin D by:  Eating foods that naturally contain vitamin D.  Eating or drinking products that have been fortified with vitamin D, such as cereals, juices, and dairy products (including milk).  Taking a vitamin D supplement or a multivitamin supplement that contains vitamin D.  Being in the sun. Your body naturally makes vitamin D when your skin is exposed to sunlight. Your body changes the sunlight into  a form of the vitamin that it can use. Contact a health care provider if:  Your symptoms do not go away.  You feel nauseous or you vomit.  You have fewer bowel movements than usual or are constipated. Summary  Vitamin D deficiency is when your body does not have enough vitamin D.  Vitamin D is important to your body for good bone health and muscle function, and it may  help prevent some diseases.  Vitamin D deficiency is primarily treated through supplementation. Your health care provider will suggest what dose is best for you.  You can get vitamin D by eating foods that contain vitamin D, by being in the sun, and by taking a vitamin D supplement or a multivitamin supplement that contains vitamin D. This information is not intended to replace advice given to you by your health care provider. Make sure you discuss any questions you have with your health care provider. Document Revised: 04/24/2018 Document Reviewed: 04/24/2018 Elsevier Patient Education  2020 [employer]   Preventive Care 54-8 Years Old, Female Preventive care refers to visits with your health care provider and lifestyle choices that can promote health and wellness. This includes:  A yearly physical exam. This may also be called an annual well check.  Regular dental visits and eye exams.  Immunizations.  Screening for certain conditions.  Healthy lifestyle choices, such as eating a healthy diet, getting regular exercise, not using drugs or products that contain nicotine and tobacco, and limiting alcohol use. What can I expect for my preventive care visit? Physical exam Your health care provider will check your:  Height and weight. This may be used to calculate body mass index (BMI), which tells if you are at a healthy weight.  Heart rate and blood pressure.  Skin for abnormal spots. Counseling Your health care provider may ask you questions about your:  Alcohol, tobacco, and drug use.  Emotional well-being.  Home and relationship well-being.  Sexual activity.  Eating habits.  Work and work Statistician.  Method of birth control.  Menstrual cycle.  Pregnancy history. What immunizations do I need?  Influenza (flu) vaccine  This is recommended every year. Tetanus, diphtheria, and pertussis (Tdap) vaccine  You may need a Td booster every 10 years. Varicella  (chickenpox) vaccine  You may need this if you have not been vaccinated. Zoster (shingles) vaccine  You may need this after age 61. Measles, mumps, and rubella (MMR) vaccine  You may need at least one dose of MMR if you were born in 1957 or later. You may also need a second dose. Pneumococcal conjugate (PCV13) vaccine  You may need this if you have certain conditions and were not previously vaccinated. Pneumococcal polysaccharide (PPSV23) vaccine  You may need one or two doses if you smoke cigarettes or if you have certain conditions. Meningococcal conjugate (MenACWY) vaccine  You may need this if you have certain conditions. Hepatitis A vaccine  You may need this if you have certain conditions or if you travel or work in places where you may be exposed to hepatitis A. Hepatitis B vaccine  You may need this if you have certain conditions or if you travel or work in places where you may be exposed to hepatitis B. Haemophilus influenzae type b (Hib) vaccine  You may need this if you have certain conditions. Human papillomavirus (HPV) vaccine  If recommended by your health care provider, you may need three doses over 6 months. You may receive vaccines as individual  doses or as more than one vaccine together in one shot (combination vaccines). Talk with your health care provider about the risks and benefits of combination vaccines. What tests do I need? Blood tests  Lipid and cholesterol levels. These may be checked every 5 years, or more frequently if you are over 69 years old.  Hepatitis C test.  Hepatitis B test. Screening  Lung cancer screening. You may have this screening every year starting at age 43 if you have a 30-pack-year history of smoking and currently smoke or have quit within the past 15 years.  Colorectal cancer screening. All adults should have this screening starting at age 27 and continuing until age 42. Your health care provider may recommend screening at  age 22 if you are at increased risk. You will have tests every 1-10 years, depending on your results and the type of screening test.  Diabetes screening. This is done by checking your blood sugar (glucose) after you have not eaten for a while (fasting). You may have this done every 1-3 years.  Mammogram. This may be done every 1-2 years. Talk with your health care provider about when you should start having regular mammograms. This may depend on whether you have a family history of breast cancer.  BRCA-related cancer screening. This may be done if you have a family history of breast, ovarian, tubal, or peritoneal cancers.  Pelvic exam and Pap test. This may be done every 3 years starting at age 66. Starting at age 1, this may be done every 5 years if you have a Pap test in combination with an HPV test. Other tests  Sexually transmitted disease (STD) testing.  Bone density scan. This is done to screen for osteoporosis. You may have this scan if you are at high risk for osteoporosis. Follow these instructions at home: Eating and drinking  Eat a diet that includes fresh fruits and vegetables, whole grains, lean protein, and low-fat dairy.  Take vitamin and mineral supplements as recommended by your health care provider.  Do not drink alcohol if: ? Your health care provider tells you not to drink. ? You are pregnant, may be pregnant, or are planning to become pregnant.  If you drink alcohol: ? Limit how much you have to 0-1 drink a day. ? Be aware of how much alcohol is in your drink. In the [place] one drink equals one 12 oz bottle of beer (355 mL), one 5 oz glass of wine (148 mL), or one 1 oz glass of hard liquor (44 mL). Lifestyle  Take daily care of your teeth and gums.  Stay active. Exercise for at least 30 minutes on 5 or more days each week.  Do not use any products that contain nicotine or tobacco, such as cigarettes, e-cigarettes, and chewing tobacco. If you need help quitting,  ask your health care provider.  If you are sexually active, practice safe sex. Use a condom or other form of birth control (contraception) in order to prevent pregnancy and STIs (sexually transmitted infections).  If told by your health care provider, take low-dose aspirin daily starting at age 12. What's next?  Visit your health care provider once a year for a well check visit.  Ask your health care provider how often you should have your eyes and teeth checked.  Stay up to date on all vaccines. This information is not intended to replace advice given to you by your health care provider. Make sure you discuss any questions you have  with your health care provider. Document Revised: 04/27/2018 Document Reviewed: 04/27/2018 Elsevier Patient Education  2020 [place] Breast self-awareness is knowing how your breasts look and feel. Doing breast self-awareness is important. It allows you to catch a breast problem early while it is still small and can be treated. All women should do breast self-awareness, including women who have had breast implants. Tell your doctor if you notice a change in your breasts. What you need:  A mirror.  A well-lit room. How to do a breast self-exam A breast self-exam is one way to learn what is normal for your breasts and to check for changes. To do a breast self-exam: Look for changes  1. Take off all the clothes above your waist. 2. Stand in front of a mirror in a room with good lighting. 3. Put your hands on your hips. 4. Push your hands down. 5. Look at your breasts and nipples in the mirror to see if one breast or nipple looks different from the other. Check to see if: ? The shape of one breast is different. ? The size of one breast is different. ? There are wrinkles, dips, and bumps in one breast and not the other. 6. Look at each breast for changes in the skin, such as: ? Redness. ? Scaly areas. 7. Look for changes in your  nipples, such as: ? Liquid around the nipples. ? Bleeding. ? Dimpling. ? Redness. ? A change in where the nipples are. Feel for changes  1. Lie on your back on the floor. 2. Feel each breast. To do this, follow these steps: ? Pick a breast to feel. ? Put the arm closest to that breast above your head. ? Use your other arm to feel the nipple area of your breast. Feel the area with the pads of your three middle fingers by making small circles with your fingers. For the first circle, press lightly. For the second circle, press harder. For the third circle, press even harder. ? Keep making circles with your fingers at the different pressures as you move down your breast. Stop when you feel your ribs. ? Move your fingers a little toward the center of your body. ? Start making circles with your fingers again, this time going up until you reach your collarbone. ? Keep making up-and-down circles until you reach your armpit. Remember to keep using the three pressures. ? Feel the other breast in the same way. 3. Sit or stand in the tub or shower. 4. With soapy water on your skin, feel each breast the same way you did in step 2 when you were lying on the floor. Write down what you find Writing down what you find can help you remember what to tell your doctor. Write down:  What is normal for each breast.  Any changes you find in each breast, including: ? The kind of changes you find. ? Whether you have pain. ? Size and location of any lumps.  When you last had your menstrual period. General tips  Check your breasts every month.  If you are breastfeeding, the best time to check your breasts is after you feed your baby or after you use a breast pump.  If you get menstrual periods, the best time to check your breasts is 5-7 days after your menstrual period is over.  With time, you will become comfortable with the self-exam, and you will begin to know if there are changes in your  breasts. Contact a doctor if you:  See a change in the shape or size of your breasts or nipples.  See a change in the skin of your breast or nipples, such as red or scaly skin.  Have fluid coming from your nipples that is not normal.  Find a lump or thick area that was not there before.  Have pain in your breasts.  Have any concerns about your breast health. Summary  Breast self-awareness includes looking for changes in your breasts, as well as feeling for changes within your breasts.  Breast self-awareness should be done in front of a mirror in a well-lit room.  You should check your breasts every month. If you get menstrual periods, the best time to check your breasts is 5-7 days after your menstrual period is over.  Let your doctor know of any changes you see in your breasts, including changes in size, changes on the skin, pain or tenderness, or fluid from your nipples that is not normal. This information is not intended to replace advice given to you by your health care provider. Make sure you discuss any questions you have with your health care provider. Document Revised: 04/04/2018 Document Reviewed: 04/04/2018 Elsevier Patient Education  [place].

## 2020-01-11 NOTE — Progress Notes (Signed)
Pt is present for annual exam. Pt c/o of spotting for 3 weeks while on the Mirena. Pt denies any other issues at this time. GAD-7=7.

## 2020-01-11 NOTE — Progress Notes (Signed)
ANNUAL PREVENTATIVE CARE GYN  ENCOUNTER NOTE  Subjective:       Kelly [name] is a 44 y.o. G2P2 female here for a routine annual gynecologic exam.  Current complaints: 1.  Difficulty getting weight under control  Doing well. Reports difficulty getting weight off, notes lifestyle changes such as walking every other day, drinking un-sweet tea, increasing water intake and portion control.   Complains of light vaginal spotting last week that lasted three (3) days.   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   Denies difficulty breathing or respiratory distress, chest pain, abdominal pain, excessive vaginal bleeding, dysuria, leg pain or swelling   Gynecologic History  No LMP recorded. (Menstrual status: IUD).   Contraception: IUD   Last Pap: 12/12/2016. Results were: Neg/Neg  Last mammogram: reports two previously at [facility] and were normal. Is scheduled next week for Mammogram.  Obstetric History  OB History  Gravida Para Term Preterm AB Living  2 2          SAB TAB Ectopic Multiple Live Births               # Outcome Date GA Lbr Len/2nd Weight Sex Delivery Anes PTL Lv  2 Para 2001     Vag-Spont     1 Para 1998    M Vag-Spont       Past Medical History:  Diagnosis Date  . ADHD   . Allergy   . Anxiety   . Asthma   . Fatigue   . GERD (gastroesophageal reflux disease)     Past Surgical History:  Procedure Laterality Date  . LAMINECTOMY      Current Outpatient Medications on File Prior to Visit  Medication Sig Dispense Refill  . albuterol (PROVENTIL HFA;VENTOLIN HFA) 108 (90 Base) MCG/ACT inhaler INHALE 2 PUFFS BY MOUTH INTO THE LUNGS EVERY 6 HOURS AS NEEDED FOR WHEEZING OR SHORTNESS OF BREATH 18 g 11  . amphetamine-dextroamphetamine (ADDERALL) 20 MG tablet Take 20 mg by mouth as needed. One tablet daily as needed    . budesonide-formoterol (SYMBICORT) 80-4.5 MCG/ACT inhaler Inhale 2 puffs into the lungs 2 (two) times  daily. 1 Inhaler 3  . dicyclomine (BENTYL) 10 MG capsule Take 1 capsule (10 mg total) by mouth 3 (three) times daily before meals. (Patient taking differently: Take 10 mg by mouth 3 (three) times daily before meals. As needed.) 90 capsule 11  . levonorgestrel (MIRENA) 20 MCG/24HR IUD 1 each by Intrauterine route once.    . montelukast (SINGULAIR) 10 MG tablet TAKE 1 TABLET BY MOUTH DAILY AT BEDTIME 30 tablet 12  . omeprazole (PRILOSEC) 40 MG capsule TAKE 1 CAPSULE BY MOUTH ONCE DAILY 90 capsule 3  . sertraline (ZOLOFT) 100 MG tablet Take 1 tablet (100 mg total) by mouth daily. 30 tablet 0  . topiramate (TOPAMAX) 50 MG tablet Take 1 tablet (50 mg total) by mouth 2 (two) times daily. 180 tablet 3  . Vitamin D, Ergocalciferol, (DRISDOL) 1.25 MG (50000 UNIT) CAPS capsule TAKE 1 CAPSULE BY MOUTH EVERY 7 DAYS *NEED APPOINTMENT / LABS FOR MORE REFILLS* 12 capsule 0   No current facility-administered medications on file prior to visit.    No Known Allergies  Social History   Socioeconomic History  . Marital status: Married    Spouse name: Not on file  . Number of children: Not on file  . Years of education: Not on file  . Highest education level: Not  on file  Occupational History  . Not on file  Tobacco Use  . Smoking status: Never Smoker  . Smokeless tobacco: Never Used  Substance and Sexual Activity  . Alcohol use: No  . Drug use: No  . Sexual activity: Yes    Birth control/protection: I.U.D.    Comment: mierna  Other Topics Concern  . Not on file  Social History Narrative  . Not on file   Social Determinants of Health   Financial Resource Strain:   . Difficulty of Paying Living Expenses:   Food Insecurity:   . Worried About Charity fundraiser in the Last Year:   . Arboriculturist in the Last Year:   Transportation Needs:   . Film/video editor (Medical):   Marland Kitchen Lack of Transportation (Non-Medical):   Physical Activity:   . Days of Exercise per Week:   . Minutes of  Exercise per Session:   Stress:   . Feeling of Stress :   Social Connections:   . Frequency of Communication with Friends and Family:   . Frequency of Social Gatherings with Friends and Family:   . Attends Religious Services:   . Active Member of Clubs or Organizations:   . Attends Archivist Meetings:   Marland Kitchen Marital Status:   Intimate Partner Violence:   . Fear of Current or Ex-Partner:   . Emotionally Abused:   Marland Kitchen Physically Abused:   . Sexually Abused:     Family History  Problem Relation Age of Onset  . Hypertension Mother   . Non-Hodgkin's lymphoma Father   . Lymphoma Father   . Cancer Father   . Cancer - Cervical Father   . Colon polyps Neg Hx   . Colon cancer Neg Hx   . Rectal cancer Neg Hx   . Stomach cancer Neg Hx     The following portions of the patient's history were reviewed and updated as appropriate: allergies, current medications, past family history, past medical history, past social history, past surgical history and problem list.  Review of Systems  ROS -negative except as noted above. Information obtained from patient.    Objective:   BP (!) 132/91   Pulse 71   Ht 5\' 3"  (1.6 m)   Wt 199 lb 12.8 oz (90.6 kg)   BMI 35.39 kg/m    CONSTITUTIONAL: Well-developed, well-nourished female in no acute distress.   PSYCHIATRIC: Normal mood and affect. Normal behavior. Normal judgment and thought content.  [place]: Alert and oriented to person, place, and time. Normal muscle tone coordination. No cranial nerve deficit noted.  HENT:  Normocephalic, atraumatic, External right and left ear normal. Oropharynx is clear and moist  EYES: Conjunctivae and EOM are normal. No scleral icterus.   NECK: Normal range of motion, supple, no masses.  Normal thyroid.   SKIN: Skin is warm and dry. No rash noted. Not diaphoretic. No erythema. No pallor.  CARDIOVASCULAR: Normal heart rate noted, regular rhythm, no murmur.  RESPIRATORY: Clear to auscultation  bilaterally. Effort and breath sounds normal, no problems with respiration noted.  BREASTS: Symmetric in size. No masses, skin changes, nipple drainage, or lymphadenopathy.  ABDOMEN: Soft, normal bowel sounds, no distention noted.  No tenderness, rebound or guarding.   PELVIC:  External Genitalia: Normal  Vagina: Normal  Uterus: Normal  Cervix: Normal. IUD String in place  Adnexa: Normal  MUSCULOSKELETAL: Normal range of motion. No tenderness.  No cyanosis, clubbing, or edema.  2+ distal pulses.  LYMPHATIC:  No Axillary, Supraclavicular, or Inguinal Adenopathy.  GAD 7 : Generalized Anxiety Score 01/11/2020 11/12/2019  Nervous, Anxious, on Edge 1 0  Control/stop worrying 1 0  Worry too much - different things 1 0  Trouble relaxing 0 0  Restless 1 0  Easily annoyed or irritable 1 0  Afraid - awful might happen 2 1  Total GAD 7 Score 7 1  Anxiety Difficulty Not difficult at all Not difficult at all   Assessment:   Annual gynecologic examination 44 y.o.   Contraception: Mirena IUD   Obesity 2   Problem List Items Addressed This Visit    None    Visit Diagnoses    Encounter for well woman exam with routine gynecological exam    -  Primary      Plan:   Pap: Not needed  Mammogram: Ordered  Labs: See orders.  Routine preventative health maintenance measures emphasized: Exercise/Diet/Weight control, Tobacco Warnings, Alcohol/Substance use risks and Stress Management. See AVS.  Reviewed Red flags and when to call the office.  Return to [facility] for [employer] with JML or sooner if needed.   Fransico Him RN [place] 01/11/20 10:50 AM

## 2020-01-11 NOTE — Progress Notes (Signed)
I have seen, interviewed, and examined the patient in conjunction with the [facility] Nurse Practitioner student and affirm the diagnosis and management plan.   Diona Fanti, CNM [facility], [facility] 01/11/20 12:58 PM

## 2020-01-12 LAB — VITAMIN D 25 HYDROXY (VIT D DEFICIENCY, FRACTURES): Vit D, 25-Hydroxy: 31 ng/mL (ref 30.0–100.0)

## 2020-01-15 ENCOUNTER — Ambulatory Visit
Admission: RE | Admit: 2020-01-15 | Discharge: 2020-01-15 | Disposition: A | Discharge status: Home / Self Care | Payer: No Typology Code available for payment source | Source: Ambulatory Visit | Attending: Family Medicine | Admitting: Family Medicine

## 2020-01-15 DIAGNOSIS — Z1231 Encounter for screening mammogram for malignant neoplasm of breast: Secondary | ICD-10-CM | POA: Diagnosis not present

## 2020-02-07 ENCOUNTER — Other Ambulatory Visit: Payer: Self-pay | Admitting: Family Medicine

## 2020-02-07 DIAGNOSIS — R928 Other abnormal and inconclusive findings on diagnostic imaging of breast: Secondary | ICD-10-CM

## 2020-02-13 ENCOUNTER — Ambulatory Visit
Admission: RE | Admit: 2020-02-13 | Discharge: 2020-02-13 | Disposition: A | Discharge status: Home / Self Care | Payer: No Typology Code available for payment source | Source: Ambulatory Visit | Attending: Family Medicine | Admitting: Family Medicine

## 2020-02-13 DIAGNOSIS — R928 Other abnormal and inconclusive findings on diagnostic imaging of breast: Secondary | ICD-10-CM

## 2020-03-04 ENCOUNTER — Other Ambulatory Visit: Payer: Self-pay | Admitting: Family Medicine

## 2020-04-01 ENCOUNTER — Telehealth: Payer: Self-pay | Admitting: Family Medicine

## 2020-04-01 NOTE — Telephone Encounter (Signed)
Provider will not be back until Friday but it is up to the patient if she want to change providers.  I will make the provider aware if she is changing.   Evana Runnels,cma

## 2020-04-01 NOTE — Telephone Encounter (Signed)
This is fine with me. I haven't seen her since 2019.

## 2020-04-01 NOTE — Telephone Encounter (Signed)
Pt called in and requested that she be moved to Dr.Scott because her husband is a patient of Dr.Scott stated that she had spoke with Dr.Scott.Please advise

## 2020-04-01 NOTE — Telephone Encounter (Signed)
Message sent to provider 

## 2020-04-01 NOTE — Telephone Encounter (Signed)
Pt called in and requested that she be moved to Dr.Scott because her husband is a patient of Dr.Scott stated that she had spoke with Dr.Scott.Please advise.  Diamantina Edinger,cma

## 2020-04-03 ENCOUNTER — Encounter: Payer: Self-pay | Admitting: Internal Medicine

## 2020-04-03 NOTE — Telephone Encounter (Signed)
I have not yet seen this pt.  Has she seen a hematologist previously.

## 2020-04-04 NOTE — Telephone Encounter (Signed)
She has not seen hematology

## 2020-04-04 NOTE — Telephone Encounter (Signed)
See me about this pt.  I feel I need to establish care with her and see her before being able to give advice on her medical condition.

## 2020-04-10 ENCOUNTER — Encounter: Payer: Self-pay | Admitting: Family Medicine

## 2020-04-23 ENCOUNTER — Other Ambulatory Visit: Payer: Self-pay | Admitting: Family Medicine

## 2020-06-02 ENCOUNTER — Telehealth (INDEPENDENT_AMBULATORY_CARE_PROVIDER_SITE_OTHER): Payer: No Typology Code available for payment source | Admitting: Nurse Practitioner

## 2020-06-02 ENCOUNTER — Other Ambulatory Visit: Payer: Self-pay

## 2020-06-02 ENCOUNTER — Encounter: Payer: Self-pay | Admitting: Nurse Practitioner

## 2020-06-02 VITALS — Ht 63.0 in | Wt 190.9 lb

## 2020-06-02 DIAGNOSIS — E6609 Other obesity due to excess calories: Secondary | ICD-10-CM | POA: Insufficient documentation

## 2020-06-02 DIAGNOSIS — Z6833 Body mass index (BMI) 33.0-33.9, adult: Secondary | ICD-10-CM | POA: Diagnosis not present

## 2020-06-02 DIAGNOSIS — K219 Gastro-esophageal reflux disease without esophagitis: Secondary | ICD-10-CM

## 2020-06-02 DIAGNOSIS — E66811 Obesity, class 1: Secondary | ICD-10-CM | POA: Insufficient documentation

## 2020-06-02 MED ORDER — OMEPRAZOLE 20 MG PO CPDR: 20.0000 mg | DELAYED_RELEASE_CAPSULE | Freq: Every day | ORAL | 3 refills | Status: DC

## 2020-06-02 NOTE — Assessment & Plan Note (Signed)
I refilled her omeprazole 20 mg daily.Weight loss is recommended for control of heartburn and reflux. General principles to avoid eating 2-3  hours before bedtime, avoid eating and drinking large amounts before exercising bending over, and overall healthy diet.   EGD 12/21/2019 for heartburn.  It revealed a normal esophagus, few fundic gland polyps, normal duodenal bulb.

## 2020-06-02 NOTE — Progress Notes (Signed)
Virtual Visit via Video Note  This visit type was conducted due to national recommendations for restrictions regarding the COVID-19 pandemic (e.g. social distancing).  This format is felt to be most appropriate for this patient at this time.  All issues noted in this document were discussed and addressed.  No physical exam was performed (except for noted visual exam findings with Video Visits).   I connected with@ on 06/02/20 at 10:30 AM EDT by a video enabled telemedicine application or telephone and verified that I am speaking with the correct person using two identifiers. Location patient: home Location provider: work or home office Persons participating in the virtual visit: patient, provider  I discussed the limitations, risks, security and privacy concerns of performing an evaluation and management service by telephone and the availability of in person appointments. I also discussed with the patient that there may be a patient responsible charge related to this service. The patient expressed understanding and agreed to proceed.  Reason for visit: Needs a refill on her omeprazole 20 mg daily.  HPI: This 44 year old patient is planning to establish care with Dr. Nicki Reaper in a few weeks.  She needs to have refills on her omeprazole 20 mg daily.  Patient reports she would like a lower dose and  that she does not need to take  40 mg any longer.  She has been unable to stop the medication altogether however.  If she goes several days without omeprazole, heartburn symptoms return.  When she takes the medication, she has no breakthrough heartburn, reflux, indigestion, or any problems with dysphagia.  She has an EGD performed 12/21/2019 for heartburn.  It revealed a normal esophagus, few fundic gland polyps, normal duodenal bulb.  She was advised to resume antireflux measures, continue present medications.  ROS: See pertinent positives and negatives per HPI.  Past Medical History:  Diagnosis Date  .  ADHD   . Allergy   . Anxiety   . Asthma   . Fatigue   . GERD (gastroesophageal reflux disease)     Past Surgical History:  Procedure Laterality Date  . LAMINECTOMY      Family History  Problem Relation Age of Onset  . Hypertension Mother   . Non-Hodgkin's lymphoma Father   . Lymphoma Father   . Cancer Father   . Cancer - Cervical Father   . Breast cancer Maternal Aunt   . Colon polyps Neg Hx   . Colon cancer Neg Hx   . Rectal cancer Neg Hx   . Stomach cancer Neg Hx     SOCIAL HX: Never smoked.    Current Outpatient Medications:  .  albuterol (PROVENTIL HFA;VENTOLIN HFA) 108 (90 Base) MCG/ACT inhaler, INHALE 2 PUFFS BY MOUTH INTO THE LUNGS EVERY 6 HOURS AS NEEDED FOR WHEEZING OR SHORTNESS OF BREATH, Disp: 18 g, Rfl: 11 .  amphetamine-dextroamphetamine (ADDERALL) 20 MG tablet, Take 20 mg by mouth as needed. One tablet daily as needed, Disp: , Rfl:  .  budesonide-formoterol (SYMBICORT) 80-4.5 MCG/ACT inhaler, INHALE 2 PUFFS INTO THE LUNGS TWICE DAILY, Disp: 10.2 g, Rfl: 1 .  levonorgestrel (MIRENA) 20 MCG/24HR IUD, 1 each by Intrauterine route once., Disp: , Rfl:  .  montelukast (SINGULAIR) 10 MG tablet, TAKE 1 TABLET BY MOUTH AT BEDTIME, Disp: 30 tablet, Rfl: 12 .  sertraline (ZOLOFT) 100 MG tablet, Take 1 tablet (100 mg total) by mouth daily., Disp: 30 tablet, Rfl: 0 .  topiramate (TOPAMAX) 50 MG tablet, Take 1 tablet (50 mg total) by  mouth 2 (two) times daily., Disp: 180 tablet, Rfl: 3 .  omeprazole (PRILOSEC) 20 MG capsule, Take 1 capsule (20 mg total) by mouth daily., Disp: 90 capsule, Rfl: 3   EXAM:  VITALS per patient if applicable:Wt 190 lbs.   GENERAL: alert, oriented, appears well and in no acute distress  HEENT: atraumatic, conjunctiva clear, no obvious abnormalities on inspection of external nose and ears  NECK: normal movements of the head and neck  LUNGS: on inspection no signs of respiratory distress, breathing rate appears normal, no obvious gross SOB,  gasping or wheezing  CV: no obvious cyanosis  MS: moves all visible extremities without noticeable abnormality  PSYCH/NEURO: pleasant and cooperative, no obvious depression or anxiety, speech and thought processing grossly intact  ASSESSMENT AND PLAN:  Discussed the following assessment and plan:  Gastroesophageal reflux disease without esophagitis  Class 1 obesity due to excess calories with body mass index (BMI) of 33.0 to 33.9 in adult, unspecified whether serious comorbidity present  GERD (gastroesophageal reflux disease) I refilled her omeprazole 20 mg daily.Weight loss is recommended for control of heartburn and reflux. General principles to avoid eating 2-3  hours before bedtime, avoid eating and drinking large amounts before exercising bending over, and overall healthy diet.   EGD 12/21/2019 for heartburn.  It revealed a normal esophagus, few fundic gland polyps, normal duodenal bulb.   I discussed the assessment and treatment plan with the patient. The patient was provided an opportunity to ask questions and all were answered. The patient agreed with the plan and demonstrated an understanding of the instructions.   The patient was advised to call back or seek an in-person evaluation if the symptoms worsen or if the condition fails to improve as anticipated.  Denice Paradise, NP Adult Nurse Practitioner [place] [phone]

## 2020-06-02 NOTE — Patient Instructions (Addendum)
I refilled her omeprazole 20 mg daily.  Weight loss is recommended for control of heartburn and reflux. General principles to avoid eating 2-3  hours before bedtime, avoid eating and drinking large amounts before exercising bending over, and overall healthy diet.   Follow-up with Dr. Nicki Reaper as planned.  Food Choices for Gastroesophageal Reflux Disease, Adult When you have gastroesophageal reflux disease (GERD), the foods you eat and your eating habits are very important. Choosing the right foods can help ease your discomfort. Think about working with a nutrition specialist (dietitian) to help you make good choices. What are tips for following this plan?  Meals  Choose healthy foods that are low in fat, such as fruits, vegetables, whole grains, low-fat dairy products, and lean meat, fish, and poultry.  Eat small meals often instead of 3 large meals a day. Eat your meals slowly, and in a place where you are relaxed. Avoid bending over or lying down until 2-3 hours after eating.  Avoid eating meals 2-3 hours before bed.  Avoid drinking a lot of liquid with meals.  Cook foods using methods other than frying. Bake, grill, or broil food instead.  Avoid or limit: ? Chocolate. ? Peppermint or spearmint. ? Alcohol. ? Pepper. ? Black and decaffeinated coffee. ? Black and decaffeinated tea. ? Bubbly (carbonated) soft drinks. ? Caffeinated energy drinks and soft drinks.  Limit high-fat foods such as: ? Fatty meat or fried foods. ? Whole milk, cream, butter, or ice cream. ? Nuts and nut butters. ? Pastries, donuts, and sweets made with butter or shortening.  Avoid foods that cause symptoms. These foods may be different for everyone. Common foods that cause symptoms include: ? Tomatoes. ? Oranges, lemons, and limes. ? Peppers. ? Spicy food. ? Onions and garlic. ? Vinegar. Lifestyle  Maintain a healthy weight. Ask your doctor what weight is healthy for you. If you need to lose weight,  work with your doctor to do so safely.  Exercise for at least 30 minutes for 5 or more days each week, or as told by your doctor.  Wear loose-fitting clothes.  Do not smoke. If you need help quitting, ask your doctor.  Sleep with the head of your bed higher than your feet. Use a wedge under the mattress or blocks under the bed frame to raise the head of the bed. Summary  When you have gastroesophageal reflux disease (GERD), food and lifestyle choices are very important in easing your symptoms.  Eat small meals often instead of 3 large meals a day. Eat your meals slowly, and in a place where you are relaxed.  Limit high-fat foods such as fatty meat or fried foods.  Avoid bending over or lying down until 2-3 hours after eating.  Avoid peppermint and spearmint, caffeine, alcohol, and chocolate. This information is not intended to replace advice given to you by your health care provider. Make sure you discuss any questions you have with your health care provider. Document Revised: 12/07/2018 Document Reviewed: 09/21/2016 Elsevier Patient Education  [place].  Gastroesophageal Reflux Disease, Adult Gastroesophageal reflux (GER) happens when acid from the stomach flows up into the tube that connects the mouth and the stomach (esophagus). Normally, food travels down the esophagus and stays in the stomach to be digested. However, when a person has GER, food and stomach acid sometimes move back up into the esophagus. If this becomes a more serious problem, the person may be diagnosed with a disease called gastroesophageal reflux disease (GERD). GERD  occurs when the reflux:  Happens often.  Causes frequent or severe symptoms.  Causes problems such as damage to the esophagus. When stomach acid comes in contact with the esophagus, the acid may cause soreness (inflammation) in the esophagus. Over time, GERD may create small holes (ulcers) in the lining of the esophagus. What are the  causes? This condition is caused by a problem with the muscle between the esophagus and the stomach (lower esophageal sphincter, or LES). Normally, the LES muscle closes after food passes through the esophagus to the stomach. When the LES is weakened or abnormal, it does not close properly, and that allows food and stomach acid to go back up into the esophagus. The LES can be weakened by certain dietary substances, medicines, and medical conditions, including:  Tobacco use.  Pregnancy.  Having a hiatal hernia.  Alcohol use.  Certain foods and beverages, such as coffee, chocolate, onions, and peppermint. What increases the risk? You are more likely to develop this condition if you:  Have an increased body weight.  Have a connective tissue disorder.  Use NSAID medicines. What are the signs or symptoms? Symptoms of this condition include:  Heartburn.  Difficult or painful swallowing.  The feeling of having a lump in the throat.  Abitter taste in the mouth.  Bad breath.  Having a large amount of saliva.  Having an upset or bloated stomach.  Belching.  Chest pain. Different conditions can cause chest pain. Make sure you see your health care provider if you experience chest pain.  Shortness of breath or wheezing.  Ongoing (chronic) cough or a night-time cough.  Wearing away of tooth enamel.  Weight loss. How is this diagnosed? Your health care provider will take a medical history and perform a physical exam. To determine if you have mild or severe GERD, your health care provider may also monitor how you respond to treatment. You may also have tests, including:  A test to examine your stomach and esophagus with a small camera (endoscopy).  A test thatmeasures the acidity level in your esophagus.  A test thatmeasures how much pressure is on your esophagus.  A barium swallow or modified barium swallow test to show the shape, size, and functioning of your  esophagus. How is this treated? The goal of treatment is to help relieve your symptoms and to prevent complications. Treatment for this condition may vary depending on how severe your symptoms are. Your health care provider may recommend:  Changes to your diet.  Medicine.  Surgery. Follow these instructions at home: Eating and drinking   Follow a diet as recommended by your health care provider. This may involve avoiding foods and drinks such as: ? Coffee and tea (with or without caffeine). ? Drinks that containalcohol. ? Energy drinks and sports drinks. ? Carbonated drinks or sodas. ? Chocolate and cocoa. ? Peppermint and mint flavorings. ? Garlic and onions. ? Horseradish. ? Spicy and acidic foods, including peppers, chili powder, curry powder, vinegar, hot sauces, and barbecue sauce. ? Citrus fruit juices and citrus fruits, such as oranges, lemons, and limes. ? Tomato-based foods, such as red sauce, chili, salsa, and pizza with red sauce. ? Fried and fatty foods, such as donuts, french fries, potato chips, and high-fat dressings. ? High-fat meats, such as hot dogs and fatty cuts of red and white meats, such as rib eye steak, sausage, ham, and bacon. ? High-fat dairy items, such as whole milk, butter, and cream cheese.  Eat small, frequent meals  instead of large meals.  Avoid drinking large amounts of liquid with your meals.  Avoid eating meals during the 2-3 hours before bedtime.  Avoid lying down right after you eat.  Do not exercise right after you eat. Lifestyle   Do not use any products that contain nicotine or tobacco, such as cigarettes, e-cigarettes, and chewing tobacco. If you need help quitting, ask your health care provider.  Try to reduce your stress by using methods such as yoga or meditation. If you need help reducing stress, ask your health care provider.  If you are overweight, reduce your weight to an amount that is healthy for you. Ask your health  care provider for guidance about a safe weight loss goal. General instructions  Pay attention to any changes in your symptoms.  Take over-the-counter and prescription medicines only as told by your health care provider. Do not take aspirin, ibuprofen, or other NSAIDs unless your health care provider told you to do so.  Wear loose-fitting clothing. Do not wear anything tight around your waist that causes pressure on your abdomen.  Raise (elevate) the head of your bed about 6 inches (15 cm).  Avoid bending over if this makes your symptoms worse.  Keep all follow-up visits as told by your health care provider. This is important. Contact a health care provider if:  You have: ? New symptoms. ? Unexplained weight loss. ? Difficulty swallowing or it hurts to swallow. ? Wheezing or a persistent cough. ? A hoarse voice.  Your symptoms do not improve with treatment. Get help right away if you:  Have pain in your arms, neck, jaw, teeth, or back.  Feel sweaty, dizzy, or light-headed.  Have chest pain or shortness of breath.  Vomit and your vomit looks like blood or coffee grounds.  Faint.  Have stool that is bloody or black.  Cannot swallow, drink, or eat. Summary  Gastroesophageal reflux happens when acid from the stomach flows up into the esophagus. GERD is a disease in which the reflux happens often, causes frequent or severe symptoms, or causes problems such as damage to the esophagus.  Treatment for this condition may vary depending on how severe your symptoms are. Your health care provider may recommend diet and lifestyle changes, medicine, or surgery.  Contact a health care provider if you have new or worsening symptoms.  Take over-the-counter and prescription medicines only as told by your health care provider. Do not take aspirin, ibuprofen, or other NSAIDs unless your health care provider told you to do so.  Keep all follow-up visits as told by your health care  provider. This is important. This information is not intended to replace advice given to you by your health care provider. Make sure you discuss any questions you have with your health care provider. Document Revised: 02/22/2018 Document Reviewed: 02/22/2018 Elsevier Patient Education  [place].

## 2020-06-25 ENCOUNTER — Encounter: Payer: Self-pay | Admitting: Internal Medicine

## 2020-06-25 ENCOUNTER — Other Ambulatory Visit: Payer: Self-pay

## 2020-06-25 ENCOUNTER — Ambulatory Visit: Payer: No Typology Code available for payment source | Admitting: Internal Medicine

## 2020-06-25 VITALS — BP 124/82 | HR 78 | Temp 97.6°F | Ht 63.62 in | Wt 192.2 lb

## 2020-06-25 DIAGNOSIS — D6851 Activated protein C resistance: Secondary | ICD-10-CM | POA: Diagnosis not present

## 2020-06-25 DIAGNOSIS — F419 Anxiety disorder, unspecified: Secondary | ICD-10-CM

## 2020-06-25 DIAGNOSIS — J4540 Moderate persistent asthma, uncomplicated: Secondary | ICD-10-CM

## 2020-06-25 DIAGNOSIS — R490 Dysphonia: Secondary | ICD-10-CM

## 2020-06-25 DIAGNOSIS — D649 Anemia, unspecified: Secondary | ICD-10-CM

## 2020-06-25 DIAGNOSIS — F909 Attention-deficit hyperactivity disorder, unspecified type: Secondary | ICD-10-CM

## 2020-06-25 DIAGNOSIS — Z23 Encounter for immunization: Secondary | ICD-10-CM

## 2020-06-25 DIAGNOSIS — E559 Vitamin D deficiency, unspecified: Secondary | ICD-10-CM

## 2020-06-25 DIAGNOSIS — Z975 Presence of (intrauterine) contraceptive device: Secondary | ICD-10-CM

## 2020-06-25 DIAGNOSIS — K219 Gastro-esophageal reflux disease without esophagitis: Secondary | ICD-10-CM

## 2020-06-25 NOTE — Progress Notes (Signed)
Patient ID: Kelly [name], female   DOB: July 15, 1976, 44 y.o.   MRN: [id number]   Subjective:    Patient ID: Kelly [name], female    DOB: 1975/10/26, 44 y.o.   MRN: [id number]  HPI This visit occurred during the SARS-CoV-2 public health emergency.  Safety protocols were in place, including screening questions prior to the visit, additional usage of staff PPE, and extensive cleaning of exam room while observing appropriate contact time as indicated for disinfecting solutions.  Patient here to establish care.  Has a history of anxiety, IDA, ADHD , reflux and asthma.  Previous w/up for anemia - colonoscopy ok.  Pelvic ultrasound - ok.  Has IUD.  Spotting prn.  On zoloft.  Controls anxiety.  Has a history of headaches.  No significant headaches now.  No significant allergy problems now.  Takes singulair and uses symbicort.  Has albuterol to use prn.  This regimen seems to control her symptoms.  Breathing stable.  Has a documented history of acid reflux.  S/p EGD 12/21/19.  History of hoarseness.  Saw ENT and speech therapy.  Has vocal cord paralysis.  Is exercising regularly now.  No chest pain reported.  No abdominal pain or bowel change reported.  Father died age 71 - non hodgkins lymphoma.  Maternal aunt with breast cancer.    Past Medical History:  Diagnosis Date  . ADHD   . Allergy   . Anxiety   . Asthma   . Fatigue   . GERD (gastroesophageal reflux disease)    Past Surgical History:  Procedure Laterality Date  . LAMINECTOMY     Family History  Problem Relation Age of Onset  . Hypertension Mother   . Non-Hodgkin's lymphoma Father   . Lymphoma Father   . Cancer Father   . Cancer - Cervical Father   . Breast cancer Maternal Aunt   . Colon polyps Neg Hx   . Colon cancer Neg Hx   . Rectal cancer Neg Hx   . Stomach cancer Neg Hx    Social History   Socioeconomic History  . Marital status: Married    Spouse name: Not on file  . Number of children: Not on file  . Years of  education: Not on file  . Highest education level: Not on file  Occupational History  . Not on file  Tobacco Use  . Smoking status: Never Smoker  . Smokeless tobacco: Never Used  Vaping Use  . Vaping Use: Never used  Substance and Sexual Activity  . Alcohol use: No  . Drug use: No  . Sexual activity: Yes    Birth control/protection: I.U.D.    Comment: mierna  Other Topics Concern  . Not on file  Social History Narrative  . Not on file   Social Determinants of Health   Financial Resource Strain:   . Difficulty of Paying Living Expenses: Not on file  Food Insecurity:   . Worried About Charity fundraiser in the Last Year: Not on file  . Ran Out of Food in the Last Year: Not on file  Transportation Needs:   . Lack of Transportation (Medical): Not on file  . Lack of Transportation (Non-Medical): Not on file  Physical Activity:   . Days of Exercise per Week: Not on file  . Minutes of Exercise per Session: Not on file  Stress:   . Feeling of Stress : Not on file  Social Connections:   . Frequency of Communication with Friends  and Family: Not on file  . Frequency of Social Gatherings with Friends and Family: Not on file  . Attends Religious Services: Not on file  . Active Member of Clubs or Organizations: Not on file  . Attends Archivist Meetings: Not on file  . Marital Status: Not on file    Outpatient Encounter Medications as of 06/25/2020  Medication Sig  . albuterol (PROVENTIL HFA;VENTOLIN HFA) 108 (90 Base) MCG/ACT inhaler INHALE 2 PUFFS BY MOUTH INTO THE LUNGS EVERY 6 HOURS AS NEEDED FOR WHEEZING OR SHORTNESS OF BREATH  . amphetamine-dextroamphetamine (ADDERALL) 20 MG tablet Take 20 mg by mouth as needed. One tablet daily as needed  . budesonide-formoterol (SYMBICORT) 80-4.5 MCG/ACT inhaler INHALE 2 PUFFS INTO THE LUNGS TWICE DAILY  . levonorgestrel (MIRENA) 20 MCG/24HR IUD 1 each by Intrauterine route once.  . montelukast (SINGULAIR) 10 MG tablet TAKE 1  TABLET BY MOUTH AT BEDTIME  . omeprazole (PRILOSEC) 20 MG capsule Take 1 capsule (20 mg total) by mouth daily.  . sertraline (ZOLOFT) 100 MG tablet Take 1 tablet (100 mg total) by mouth daily. (Patient taking differently: Take 50 mg by mouth daily. )  . topiramate (TOPAMAX) 50 MG tablet Take 1 tablet (50 mg total) by mouth 2 (two) times daily.   No facility-administered encounter medications on file as of 06/25/2020.    Review of Systems  Constitutional: Negative for appetite change and unexpected weight change.  HENT: Negative for congestion and sinus pressure.   Respiratory: Negative for cough, chest tightness and shortness of breath.   Cardiovascular: Negative for chest pain, palpitations and leg swelling.  Gastrointestinal: Negative for abdominal pain, diarrhea, nausea and vomiting.  Genitourinary: Negative for difficulty urinating and dysuria.  Musculoskeletal: Negative for joint swelling and myalgias.  Neurological: Negative for dizziness, light-headedness and headaches.  Psychiatric/Behavioral: Negative for agitation and dysphoric mood.       Objective:    Physical Exam Vitals reviewed.  Constitutional:      General: She is not in acute distress.    Appearance: Normal appearance.  HENT:     Head: Normocephalic and atraumatic.     Right Ear: External ear normal.     Left Ear: External ear normal.  Eyes:     General: No scleral icterus.       Right eye: No discharge.        Left eye: No discharge.     Conjunctiva/sclera: Conjunctivae normal.  Neck:     Thyroid: No thyromegaly.  Cardiovascular:     Rate and Rhythm: Normal rate and regular rhythm.  Pulmonary:     Effort: No respiratory distress.     Breath sounds: Normal breath sounds. No wheezing.  Abdominal:     General: Bowel sounds are normal.     Palpations: Abdomen is soft.     Tenderness: There is no abdominal tenderness.  Musculoskeletal:        General: No swelling or tenderness.     Cervical back: Neck  supple. No tenderness.  Lymphadenopathy:     Cervical: No cervical adenopathy.  Skin:    Findings: No erythema or rash.  Neurological:     Mental Status: She is alert.  Psychiatric:        Mood and Affect: Mood normal.        Behavior: Behavior normal.     BP 124/82   Pulse 78   Temp 97.6 F (36.4 C) (Oral)   Ht 5' 3.62" (1.616 m)   Wt  192 lb 3.2 oz (87.2 kg)   LMP  (LMP Unknown)   SpO2 97%   BMI 33.38 kg/m  Wt Readings from Last 3 Encounters:  06/25/20 192 lb 3.2 oz (87.2 kg)  06/02/20 190 lb 14.4 oz (86.6 kg)  01/11/20 199 lb 12.8 oz (90.6 kg)     Lab Results  Component Value Date   WBC 6.9 11/12/2019   HGB 14.9 11/12/2019   HCT 44.7 11/12/2019   PLT 237.0 11/12/2019   GLUCOSE 87 11/12/2019   ALT 17 11/12/2019   AST 15 11/12/2019   NA 139 11/12/2019   K 3.6 11/12/2019   CL 109 11/12/2019   CREATININE 0.76 11/12/2019   BUN 14 11/12/2019   CO2 22 11/12/2019   TSH 2.44 11/12/2019   INR 1.0 04/12/2018   HGBA1C 5.4 11/12/2019    MM DIAG BREAST TOMO UNI RIGHT  Result Date: 02/13/2020 CLINICAL DATA:  Patient was called back from screening mammogram for possible distortion in the right breast. EXAM: DIGITAL DIAGNOSTIC UNILATERAL RIGHT MAMMOGRAM WITH TOMO AND CAD COMPARISON:  Previous exam(s). ACR Breast Density Category b: There are scattered areas of fibroglandular density. FINDINGS: Additional imaging of the right breast was performed. No persistent mass, distortion or malignant type microcalcifications identified. Mammographic images were processed with CAD. IMPRESSION: No evidence of malignancy in the right breast. RECOMMENDATION: Bilateral screening mammogram in May of 2022 is recommended. I have discussed the findings and recommendations with the patient. If applicable, a reminder letter will be sent to the patient regarding the next appointment. BI-RADS CATEGORY  1: Negative. Electronically Signed   By: Lillia Mountain M.D.   On: 02/13/2020 09:22       Assessment  & [name]:   Problem List Items Addressed This Visit    Vitamin D deficiency    Follow vitamin D level.        IUD (intrauterine device) in place    Has been followed by gyn.  Has IUD in place.        Hoarseness    Evaluated by speech therapy and ENT.  Has vocal cord paralysis.        GERD (gastroesophageal reflux disease)    On omeprazole.  S/p EGD 12/21/19.        Factor 5 Leiden mutation, heterozygous ([place])    States has history of this mutation.        Asthma    Continue symbicort and singulair.  Breathing stable.  Follow.       Anxiety    On zoloft.  Appears to be doing well on current regimen.  Takes 1/2 tablet per day.        Anemia    Follow cbc and check iron studies.        ADHD    Has been worked up and diagnosed with ADHD.  On adderall.  Does not take daily.  Follow.        Other Visit Diagnoses    Needs flu shot    -  Primary   Relevant Orders   Flu Vaccine QUAD 6+ mos PF IM (Fluarix Quad PF) (Completed)       [name], MD

## 2020-06-30 ENCOUNTER — Encounter: Payer: Self-pay | Admitting: Internal Medicine

## 2020-06-30 DIAGNOSIS — F909 Attention-deficit hyperactivity disorder, unspecified type: Secondary | ICD-10-CM | POA: Insufficient documentation

## 2020-06-30 DIAGNOSIS — R490 Dysphonia: Secondary | ICD-10-CM | POA: Insufficient documentation

## 2020-06-30 NOTE — Assessment & Plan Note (Signed)
States has history of this mutation.

## 2020-06-30 NOTE — Assessment & Plan Note (Signed)
Follow vitamin D level.  

## 2020-06-30 NOTE — Assessment & Plan Note (Signed)
Follow cbc and check iron studies.

## 2020-06-30 NOTE — Assessment & Plan Note (Signed)
Continue symbicort and singulair.  Breathing stable.  Follow.

## 2020-06-30 NOTE — Assessment & Plan Note (Signed)
On omeprazole.  S/p EGD 12/21/19.

## 2020-06-30 NOTE — Assessment & Plan Note (Signed)
Evaluated by speech therapy and ENT.  Has vocal cord paralysis.

## 2020-06-30 NOTE — Assessment & Plan Note (Signed)
Has been followed by gyn.  Has IUD in place.

## 2020-06-30 NOTE — Assessment & Plan Note (Signed)
Has been worked up and diagnosed with ADHD.  On adderall.  Does not take daily.  Follow.

## 2020-06-30 NOTE — Assessment & Plan Note (Signed)
On zoloft.  Appears to be doing well on current regimen.  Takes 1/2 tablet per day.

## 2020-07-16 ENCOUNTER — Other Ambulatory Visit: Payer: Self-pay | Admitting: Family Medicine

## 2020-08-13 ENCOUNTER — Other Ambulatory Visit: Payer: Self-pay | Admitting: Internal Medicine

## 2020-09-09 ENCOUNTER — Other Ambulatory Visit: Payer: Self-pay | Admitting: Internal Medicine

## 2020-09-18 ENCOUNTER — Encounter: Payer: Self-pay | Admitting: Internal Medicine

## 2020-11-23 ENCOUNTER — Other Ambulatory Visit: Payer: Self-pay | Admitting: Family Medicine

## 2020-12-17 ENCOUNTER — Encounter: Payer: Self-pay | Admitting: Internal Medicine

## 2020-12-17 NOTE — Telephone Encounter (Signed)
Per Kelly [name], patient does not want to wait until a new patient appt is available.

## 2020-12-19 NOTE — Telephone Encounter (Signed)
New patient appts are booked out. Pt does not want to wait. Do you want to schedule new pt appt in a couple of weeks?

## 2020-12-20 NOTE — Telephone Encounter (Signed)
I can work him in - 12:00 spot (can schedule Wednesday 12;00 12/31/20.  Please see if they can send out new pt paper work so we have this when he comes in.

## 2020-12-23 NOTE — Telephone Encounter (Signed)
LMTCB to schedule son for appt

## 2020-12-23 NOTE — Telephone Encounter (Signed)
New patient scheduled.

## 2020-12-24 ENCOUNTER — Ambulatory Visit: Payer: No Typology Code available for payment source | Admitting: Family Medicine

## 2021-01-12 ENCOUNTER — Encounter: Payer: Self-pay | Admitting: Certified Nurse Midwife

## 2021-01-12 ENCOUNTER — Encounter: Payer: No Typology Code available for payment source | Admitting: Certified Nurse Midwife

## 2021-01-15 ENCOUNTER — Encounter: Payer: Self-pay | Admitting: Internal Medicine

## 2021-01-20 ENCOUNTER — Ambulatory Visit: Payer: No Typology Code available for payment source | Admitting: Internal Medicine

## 2021-01-20 ENCOUNTER — Other Ambulatory Visit: Payer: Self-pay

## 2021-01-20 VITALS — BP 130/78 | HR 89 | Temp 97.9°F | Resp 16 | Ht 63.0 in | Wt 196.8 lb

## 2021-01-20 DIAGNOSIS — Z13220 Encounter for screening for lipoid disorders: Secondary | ICD-10-CM

## 2021-01-20 DIAGNOSIS — D6851 Activated protein C resistance: Secondary | ICD-10-CM

## 2021-01-20 DIAGNOSIS — F419 Anxiety disorder, unspecified: Secondary | ICD-10-CM

## 2021-01-20 DIAGNOSIS — Z1231 Encounter for screening mammogram for malignant neoplasm of breast: Secondary | ICD-10-CM

## 2021-01-20 DIAGNOSIS — K219 Gastro-esophageal reflux disease without esophagitis: Secondary | ICD-10-CM

## 2021-01-20 DIAGNOSIS — D649 Anemia, unspecified: Secondary | ICD-10-CM

## 2021-01-20 DIAGNOSIS — J4540 Moderate persistent asthma, uncomplicated: Secondary | ICD-10-CM | POA: Diagnosis not present

## 2021-01-20 DIAGNOSIS — Z6834 Body mass index (BMI) 34.0-34.9, adult: Secondary | ICD-10-CM

## 2021-01-20 NOTE — Progress Notes (Signed)
Patient ID: Kelly [name], female   DOB: 05-27-1976, 45 y.o.   MRN: [id number]   Subjective:    Patient ID: Kelly [name], female    DOB: 1976/07/16, 45 y.o.   MRN: [id number]  HPI This visit occurred during the SARS-CoV-2 public health emergency.  Safety protocols were in place, including screening questions prior to the visit, additional usage of staff PPE, and extensive cleaning of exam room while observing appropriate contact time as indicated for disinfecting solutions.  Patient here for a scheduled follow up.  Here to follow up regarding her anxiety, IDA, reflux and asthma.  Reports increased anxiety.  On zoloft.  Discussed adjusting dose.  Need to clarify dosing prior to increase.  Discussed seeing a counselor.  Information given.  Tries to stay active.  No chest pain or sob reported.  Was doing optavia diet.  Stopped.  Has gained back weight.  Frustrated about this.  Discussed diet and exercise. Discussed prescription treatment options.  She is interested. She is trying to watch her diet.  Is walking 2-3x/week.  She is eating on the run.  Drinking water.  No acid reflux reported.  No abdominal pain or bowel change reported.     Past Medical History:  Diagnosis Date  . ADHD   . Allergy   . Anxiety   . Asthma   . Fatigue   . GERD (gastroesophageal reflux disease)    Past Surgical History:  Procedure Laterality Date  . LAMINECTOMY     Family History  Problem Relation Age of Onset  . Hypertension Mother   . Non-Hodgkin's lymphoma Father   . Lymphoma Father   . Cancer Father   . Cancer - Cervical Father   . Breast cancer Maternal Aunt   . Colon polyps Neg Hx   . Colon cancer Neg Hx   . Rectal cancer Neg Hx   . Stomach cancer Neg Hx    Social History   Socioeconomic History  . Marital status: Married    Spouse name: Not on file  . Number of children: Not on file  . Years of education: Not on file  . Highest education level: Not on file  Occupational History  . Not  on file  Tobacco Use  . Smoking status: Never Smoker  . Smokeless tobacco: Never Used  Vaping Use  . Vaping Use: Never used  Substance and Sexual Activity  . Alcohol use: No  . Drug use: No  . Sexual activity: Yes    Birth control/protection: I.U.D.    Comment: mierna  Other Topics Concern  . Not on file  Social History Narrative  . Not on file   Social Determinants of Health   Financial Resource Strain: Not on file  Food Insecurity: Not on file  Transportation Needs: Not on file  Physical Activity: Not on file  Stress: Not on file  Social Connections: Not on file    Outpatient Encounter Medications as of 01/20/2021  Medication Sig  . albuterol (PROVENTIL HFA;VENTOLIN HFA) 108 (90 Base) MCG/ACT inhaler INHALE 2 PUFFS BY MOUTH INTO THE LUNGS EVERY 6 HOURS AS NEEDED FOR WHEEZING OR SHORTNESS OF BREATH  . amphetamine-dextroamphetamine (ADDERALL) 20 MG tablet Take 20 mg by mouth as needed. One tablet daily as needed  . budesonide-formoterol (SYMBICORT) 80-4.5 MCG/ACT inhaler INHALE 2 PUFFS INTO THE LUNGS TWICE DAILY  . levonorgestrel (MIRENA) 20 MCG/24HR IUD 1 each by Intrauterine route once.  . montelukast (SINGULAIR) 10 MG tablet TAKE 1 TABLET BY  MOUTH AT BEDTIME  . omeprazole (PRILOSEC) 20 MG capsule Take 1 capsule (20 mg total) by mouth daily.  . sertraline (ZOLOFT) 100 MG tablet TAKE 1 TABLET BY MOUTH ONCE A DAY  . [DISCONTINUED] topiramate (TOPAMAX) 50 MG tablet Take 1 tablet (50 mg total) by mouth 2 (two) times daily.   No facility-administered encounter medications on file as of 01/20/2021.    Review of Systems  Constitutional: Negative for appetite change.       Concern regarding weight gain.   HENT: Negative for congestion and sinus pressure.   Respiratory: Negative for cough, chest tightness and shortness of breath.   Cardiovascular: Negative for chest pain, palpitations and leg swelling.  Gastrointestinal: Negative for abdominal pain, diarrhea, nausea and  vomiting.  Genitourinary: Negative for difficulty urinating and dysuria.  Musculoskeletal: Negative for joint swelling and myalgias.  Skin: Negative for color change and rash.  Neurological: Negative for dizziness, light-headedness and headaches.  Psychiatric/Behavioral: Negative for agitation and dysphoric mood.       Increased stress as outlined.         Objective:    Physical Exam Vitals reviewed.  Constitutional:      General: She is not in acute distress.    Appearance: Normal appearance.  HENT:     Head: Normocephalic and atraumatic.     Right Ear: External ear normal.     Left Ear: External ear normal.  Eyes:     General: No scleral icterus.       Right eye: No discharge.        Left eye: No discharge.     Conjunctiva/sclera: Conjunctivae normal.  Neck:     Thyroid: No thyromegaly.  Cardiovascular:     Rate and Rhythm: Normal rate and regular rhythm.  Pulmonary:     Effort: No respiratory distress.     Breath sounds: Normal breath sounds. No wheezing.  Abdominal:     General: Bowel sounds are normal.     Palpations: Abdomen is soft.     Tenderness: There is no abdominal tenderness.  Musculoskeletal:        General: No swelling or tenderness.     Cervical back: Neck supple. No tenderness.  Lymphadenopathy:     Cervical: No cervical adenopathy.  Skin:    Findings: No erythema or rash.  Neurological:     Mental Status: She is alert.  Psychiatric:        Mood and Affect: Mood normal.        Behavior: Behavior normal.     BP 130/78   Pulse 89   Temp 97.9 F (36.6 C)   Resp 16   Ht $R'5\' 3"'FF$  (1.6 m)   Wt 196 lb 12.8 oz (89.3 kg)   SpO2 99%   BMI 34.86 kg/m  Wt Readings from Last 3 Encounters:  01/20/21 196 lb 12.8 oz (89.3 kg)  06/25/20 192 lb 3.2 oz (87.2 kg)  06/02/20 190 lb 14.4 oz (86.6 kg)     Lab Results  Component Value Date   WBC 6.9 11/12/2019   HGB 14.9 11/12/2019   HCT 44.7 11/12/2019   PLT 237.0 11/12/2019   GLUCOSE 87 11/12/2019    ALT 17 11/12/2019   AST 15 11/12/2019   NA 139 11/12/2019   K 3.6 11/12/2019   CL 109 11/12/2019   CREATININE 0.76 11/12/2019   BUN 14 11/12/2019   CO2 22 11/12/2019   TSH 2.44 11/12/2019   INR 1.0 04/12/2018   HGBA1C  5.4 11/12/2019    MM DIAG BREAST TOMO UNI RIGHT  Result Date: 02/13/2020 CLINICAL DATA:  Patient was called back from screening mammogram for possible distortion in the right breast. EXAM: DIGITAL DIAGNOSTIC UNILATERAL RIGHT MAMMOGRAM WITH TOMO AND CAD COMPARISON:  Previous exam(s). ACR Breast Density Category b: There are scattered areas of fibroglandular density. FINDINGS: Additional imaging of the right breast was performed. No persistent mass, distortion or malignant type microcalcifications identified. Mammographic images were processed with CAD. IMPRESSION: No evidence of malignancy in the right breast. RECOMMENDATION: Bilateral screening mammogram in May of 2022 is recommended. I have discussed the findings and recommendations with the patient. If applicable, a reminder letter will be sent to the patient regarding the next appointment. BI-RADS CATEGORY  1: Negative. Electronically Signed   By: Lillia Mountain M.D.   On: 02/13/2020 09:22       Assessment & [name]:   Problem List Items Addressed This Visit    Anemia    Follow cbc.       Relevant Orders   CBC with Differential/Platelet   TSH   Comp Met (CMET)   Anxiety    On zoloft.  Increased stress and anxiety.  Call back with dosing of zoloft.  May need to adjust or add medication.  Follow.        Asthma    Continues on symbicort and singulair.  Breathing stable.  Follow.       BMI 34.0-34.9,adult    Discussed diet and exercise.  Off optavia diet now.  Concerned regarding weight gain.  Has been eating on the run.  Discussed diet and exercise.  She is walking.  Discussed prescription medication.  Agreeable for CCM referral.       Relevant Orders   AMB Referral to [facility]   Factor 5 Leiden  mutation, heterozygous [facility])    Has a documented history of this mutation.  Follow.       GERD (gastroesophageal reflux disease)    No upper symptoms reported.  On omeprazole.  S/p EGD 12/21/19       Other Visit Diagnoses    Encounter for screening mammogram for malignant neoplasm of breast    -  Primary   Relevant Orders   MM 3D SCREEN BREAST BILATERAL   Screening cholesterol level       Relevant Orders   Lipid panel       [name], MD

## 2021-01-26 ENCOUNTER — Encounter: Payer: Self-pay | Admitting: Internal Medicine

## 2021-01-26 NOTE — Assessment & Plan Note (Signed)
Continues on symbicort and singulair.  Breathing stable.  Follow.  

## 2021-01-26 NOTE — Assessment & Plan Note (Signed)
On zoloft.  Increased stress and anxiety.  Call back with dosing of zoloft.  May need to adjust or add medication.  Follow.

## 2021-01-26 NOTE — Assessment & Plan Note (Signed)
No upper symptoms reported.  On omeprazole.  S/p EGD 12/21/19 

## 2021-01-26 NOTE — Assessment & Plan Note (Signed)
Follow cbc.  

## 2021-01-26 NOTE — Assessment & Plan Note (Signed)
Discussed diet and exercise.  Off optavia diet now.  Concerned regarding weight gain.  Has been eating on the run.  Discussed diet and exercise.  She is walking.  Discussed prescription medication.  Agreeable for CCM referral.

## 2021-01-26 NOTE — Assessment & Plan Note (Signed)
Has a documented history of this mutation.  Follow.

## 2021-01-27 ENCOUNTER — Other Ambulatory Visit: Payer: Self-pay

## 2021-01-27 ENCOUNTER — Ambulatory Visit
Admission: RE | Admit: 2021-01-27 | Discharge: 2021-01-27 | Disposition: A | Discharge status: Home / Self Care | Payer: No Typology Code available for payment source | Source: Ambulatory Visit | Attending: Internal Medicine | Admitting: Internal Medicine

## 2021-01-27 ENCOUNTER — Telehealth: Payer: Self-pay

## 2021-01-27 DIAGNOSIS — Z1231 Encounter for screening mammogram for malignant neoplasm of breast: Secondary | ICD-10-CM | POA: Diagnosis present

## 2021-01-27 NOTE — Chronic Care Management (AMB) (Signed)
  Care Management   Outreach Note  01/27/2021 Name: Kelly [name] MRN: [id number] DOB: 1976-07-24  Referred by: [name], MD Reason for referral : Care Coordination (Outreach to schedule referral for Pharm D )   An unsuccessful telephone outreach was attempted today. The patient was referred to the case management team for assistance with care management and care coordination.   Follow Up Plan: A HIPAA compliant phone message was left for the patient providing contact information and requesting a return call.  The care management team will reach out to the patient again over the next 7 days.  If patient returns call to provider office, please advise to call [place] * at [phone]*  Noreene Larsson, [place], [place] Management  [place], [place] [address] Direct Dial: [phone] [email or web address]

## 2021-01-28 ENCOUNTER — Other Ambulatory Visit: Payer: Self-pay | Admitting: Internal Medicine

## 2021-01-28 DIAGNOSIS — R928 Other abnormal and inconclusive findings on diagnostic imaging of breast: Secondary | ICD-10-CM

## 2021-01-28 DIAGNOSIS — N6489 Other specified disorders of breast: Secondary | ICD-10-CM

## 2021-01-28 NOTE — Chronic Care Management (AMB) (Signed)
  Care Management   Note  01/28/2021 Name: Kelly [name] MRN: [id number] DOB: [birth year]  Kelly [name] is a 45 y.o. year old female who is a primary care patient of [name], MD. I reached out to Kelly [name] by phone today in response to a referral sent by Kelly [name]'s health [name].    Kelly [name] was given information about care management services today including:  1. Care management services include personalized support from designated clinical staff supervised by her physician, including individualized [name] of care and coordination with other care providers 2. 24/7 contact phone numbers for assistance for urgent and routine care needs. 3. The patient may stop care management services at any time by phone call to the office staff.  Patient agreed to services and verbal consent obtained.   Follow up [name]: Telephone appointment with care management team member scheduled for:02/13/2021  Noreene Larsson, [place], [place], [place] [address] Direct Dial: [phone] [email or web address]

## 2021-02-02 ENCOUNTER — Other Ambulatory Visit: Payer: No Typology Code available for payment source

## 2021-02-04 ENCOUNTER — Other Ambulatory Visit: Payer: Self-pay

## 2021-02-04 ENCOUNTER — Ambulatory Visit
Admission: RE | Admit: 2021-02-04 | Discharge: 2021-02-04 | Disposition: A | Discharge status: Home / Self Care | Payer: No Typology Code available for payment source | Source: Ambulatory Visit | Attending: Internal Medicine | Admitting: Internal Medicine

## 2021-02-04 DIAGNOSIS — R928 Other abnormal and inconclusive findings on diagnostic imaging of breast: Secondary | ICD-10-CM | POA: Insufficient documentation

## 2021-02-04 DIAGNOSIS — N6489 Other specified disorders of breast: Secondary | ICD-10-CM

## 2021-02-05 ENCOUNTER — Ambulatory Visit: Payer: No Typology Code available for payment source | Admitting: Internal Medicine

## 2021-02-05 ENCOUNTER — Other Ambulatory Visit (INDEPENDENT_AMBULATORY_CARE_PROVIDER_SITE_OTHER): Payer: No Typology Code available for payment source

## 2021-02-05 DIAGNOSIS — D649 Anemia, unspecified: Secondary | ICD-10-CM

## 2021-02-05 DIAGNOSIS — Z13220 Encounter for screening for lipoid disorders: Secondary | ICD-10-CM

## 2021-02-05 LAB — TSH: TSH: 1.95 u[IU]/mL (ref 0.35–4.50)

## 2021-02-05 LAB — COMPREHENSIVE METABOLIC PANEL
ALT: 15 U/L (ref 0–35)
AST: 14 U/L (ref 0–37)
Albumin: 4.2 g/dL (ref 3.5–5.2)
Alkaline Phosphatase: 63 U/L (ref 39–117)
BUN: 14 mg/dL (ref 6–23)
CO2: 26 mEq/L (ref 19–32)
Calcium: 10.1 mg/dL (ref 8.4–10.5)
Chloride: 106 mEq/L (ref 96–112)
Creatinine, Ser: 0.75 mg/dL (ref 0.40–1.20)
GFR: 96.78 mL/min (ref >60.00)
Glucose, Bld: 91 mg/dL (ref 70–99)
Potassium: 3.9 mEq/L (ref 3.5–5.1)
Sodium: 140 mEq/L (ref 135–145)
Total Bilirubin: 0.4 mg/dL (ref 0.2–1.2)
Total Protein: 6.9 g/dL (ref 6.0–8.3)

## 2021-02-05 LAB — CBC WITH DIFFERENTIAL/PLATELET
Basophils Absolute: 0.1 10*3/uL (ref 0.0–0.1)
Basophils Relative: 0.9 % (ref 0.0–3.0)
Eosinophils Absolute: 0.2 10*3/uL (ref 0.0–0.7)
Eosinophils Relative: 2.9 % (ref 0.0–5.0)
HCT: 42.6 % (ref 36.0–46.0)
Hemoglobin: 14.4 g/dL (ref 12.0–15.0)
Lymphocytes Relative: 32 % (ref 12.0–46.0)
Lymphs Abs: 2.4 10*3/uL (ref 0.7–4.0)
MCHC: 33.8 g/dL (ref 30.0–36.0)
MCV: 86.9 fl (ref 78.0–100.0)
Monocytes Absolute: 0.4 10*3/uL (ref 0.1–1.0)
Monocytes Relative: 5.5 % (ref 3.0–12.0)
Neutro Abs: 4.4 10*3/uL (ref 1.4–7.7)
Neutrophils Relative %: 58.7 % (ref 43.0–77.0)
Platelets: 219 10*3/uL (ref 150.0–400.0)
RBC: 4.9 Mil/uL (ref 3.87–5.11)
RDW: 12.9 % (ref 11.5–15.5)
WBC: 7.6 10*3/uL (ref 4.0–10.5)

## 2021-02-05 LAB — LIPID PANEL
Cholesterol: 176 mg/dL (ref 0–200)
HDL: 43 mg/dL (ref >39.00)
NonHDL: 133.27
Total CHOL/HDL Ratio: 4
Triglycerides: 253 mg/dL — ABNORMAL HIGH (ref 0.0–149.0)
VLDL: 50.6 mg/dL — ABNORMAL HIGH (ref 0.0–40.0)

## 2021-02-05 LAB — LDL CHOLESTEROL, DIRECT: Direct LDL: 115 mg/dL

## 2021-02-06 ENCOUNTER — Telehealth: Payer: Self-pay

## 2021-02-06 NOTE — Telephone Encounter (Signed)
Left message to call back for lab results.

## 2021-02-06 NOTE — Telephone Encounter (Signed)
LMTCB

## 2021-02-09 NOTE — Telephone Encounter (Signed)
PT called to return missed call

## 2021-02-09 NOTE — Telephone Encounter (Signed)
Results given.

## 2021-02-13 ENCOUNTER — Telehealth: Payer: Self-pay | Admitting: Pharmacist

## 2021-02-13 ENCOUNTER — Telehealth: Payer: No Typology Code available for payment source

## 2021-02-13 NOTE — Telephone Encounter (Signed)
  Chronic Care Management   Note  02/13/2021 Name: KRISTY SCHOMBURG MRN: [id number] DOB: [birth year]   Attempted to contact patient for scheduled appointment for medication management support. Left HIPAA compliant message for patient to return my call at their convenience.    Plan: - If I do not hear back from the patient by end of business today, will collaborate with Care Guide to outreach to schedule follow up with me   [name], PharmD, [place], [place] Pharmacist [employer] at [employer] [phone]

## 2021-02-24 ENCOUNTER — Ambulatory Visit: Payer: No Typology Code available for payment source | Admitting: Pharmacist

## 2021-02-24 DIAGNOSIS — J4540 Moderate persistent asthma, uncomplicated: Secondary | ICD-10-CM

## 2021-02-24 DIAGNOSIS — Z6834 Body mass index (BMI) 34.0-34.9, adult: Secondary | ICD-10-CM

## 2021-02-24 MED ORDER — METFORMIN HCL ER 500 MG PO TB24: 500.0000 mg | ORAL_TABLET | Freq: Two times a day (BID) | ORAL | 2 refills | Status: DC

## 2021-02-24 MED ORDER — OZEMPIC (0.25 OR 0.5 MG/DOSE) 2 MG/1.5ML ~~LOC~~ SOPN: 0.5000 mg | PEN_INJECTOR | SUBCUTANEOUS | 2 refills | Status: DC

## 2021-02-24 NOTE — Patient Instructions (Signed)
Kelly [name],   It was great talking to you today!  Start metformin XR 500 mg twice daily. I recommend you take this with food on your stomach, as that generally helps improve tolerability. You may have some stomach upset or diarrhea, but this generally resolves after about a week on metformin therapy.   Focus on lean proteins, fruits and vegetables, whole grains/fibers, and increased hydration with water. Portion sizes for carbohydrates should be relatively smaller.   Set an attainable exercise goal, such as 1-2 days a week of 10-15 minutes of exercise. Once you have that in your routine, work up from there.   Let me know if you have any questions or concerns in the meantime!  [name], PharmD [phone]  Visit Information  PATIENT GOALS:   Goals Addressed               This Visit's Progress     Patient Stated     Health Management (pt-stated)        Patient Goals/Self-Care Activities Over the next 90 days, patient will:  - take medications as prescribed target a minimum of 150 minutes of moderate intensity exercise weekly engage in dietary modifications by reducing carbohydrate/complex sugar intake         Kelly [name] was given information about Care Management services by the embedded care coordination team including:  Care Management services include personalized support from designated clinical staff supervised by her physician, including individualized plan of care and coordination with other care providers 24/7 contact phone numbers for assistance for urgent and routine care needs. The patient may stop CCM services at any time (effective at the end of the month) by phone call to the office staff.  Patient agreed to services and verbal consent obtained.   Patient verbalizes understanding of instructions provided today and agrees to view in [place].   Plan: Telephone follow up appointment with care management team member scheduled for:  ~ 6 weeks  [name],  PharmD, [place], [place] Clinical Pharmacist [employer] at [employer] [phone]

## 2021-02-24 NOTE — Chronic Care Management (AMB) (Signed)
Care Management   Pharmacy Note  02/24/2021 Name: Kelly [name] MRN: [id number] DOB: 06/03/1976  Subjective: Kelly [name] is a 45 y.o. year old female who is a primary care patient of [name], MD. The Care Management team was consulted for assistance with care management and care coordination needs.    Engaged with patient by telephone for initial visit in response to provider referral for pharmacy case management and/or care coordination services.   The patient was given information about Care Management services today including:  Care Management services includes personalized support from designated clinical staff supervised by the patient's primary care provider, including individualized plan of care and coordination with other care providers. 24/7 contact phone numbers for assistance for urgent and routine care needs. The patient may stop case management services at any time by phone call to the office staff.  Patient agreed to services and consent obtained.  Assessment:  Review of patient status, including review of consultants reports, laboratory and other test data, was performed as part of comprehensive evaluation and provision of chronic care management services.   SDOH (Social Determinants of Health) assessments and interventions performed:  SDOH Interventions    Flowsheet Row Most Recent Value  SDOH Interventions   Financial Strain Interventions Other (Comment)  [evaluated medication access resources]        Objective:  Lab Results  Component Value Date   CREATININE 0.75 02/05/2021   CREATININE 0.76 11/12/2019   CREATININE 0.84 01/12/2019    Lab Results  Component Value Date   HGBA1C 5.4 11/12/2019       Component Value Date/Time   CHOL 176 02/05/2021 1439   TRIG 253.0 (H) 02/05/2021 1439   HDL 43.00 02/05/2021 1439   CHOLHDL 4 02/05/2021 1439   VLDL 50.6 (H) 02/05/2021 1439   LDLDIRECT 115.0 02/05/2021 1439    Clinical ASCVD: No  The  10-year ASCVD risk score [name] DC Jr., et al., 2013) is: 0.9%   Values used to calculate the score:     Age: 26 years     Sex: Female     Is Non-Hispanic African American: No     Diabetic: No     Tobacco smoker: No     Systolic Blood Pressure: 269 mmHg     Is BP treated: No     HDL Cholesterol: 43 mg/dL     Total Cholesterol: 176 mg/dL     BP Readings from Last 3 Encounters:  01/20/21 130/78  06/25/20 124/82  01/11/20 (!) 132/91    Care Plan  No Known Allergies  Medications Reviewed Today     Reviewed by De Hollingshead, RPH-CPP (Pharmacist) on 02/24/21 at 0904  Med List Status: <None>   Medication Order Taking? Sig Documenting Provider Last Dose Status Informant  albuterol (PROVENTIL HFA;VENTOLIN HFA) 108 (90 Base) MCG/ACT inhaler [id number] Yes INHALE 2 PUFFS BY MOUTH INTO THE LUNGS EVERY 6 HOURS AS NEEDED FOR WHEEZING OR SHORTNESS OF BREATH Shambley, Melody N, CNM Taking Active            Med Note [name], [name] E   Tue Feb 24, 2021  9:03 AM) Taking ~ 1-2 times weekly  amphetamine-dextroamphetamine (ADDERALL) 20 MG tablet [id number] Yes Take 20 mg by mouth as needed. One tablet daily as needed [provider] Taking Active Self  budesonide-formoterol (SYMBICORT) 80-4.5 MCG/ACT inhaler [id number] Yes INHALE 2 PUFFS INTO THE LUNGS TWICE DAILY [name], MD Taking Active   levonorgestrel (MIRENA) 20 MCG/24HR IUD [id number]  Yes 1 each by Intrauterine route once. [provider] Taking Active   montelukast (SINGULAIR) 10 MG tablet [id number] Yes TAKE 1 TABLET BY MOUTH AT BEDTIME [name], MD Taking Active   omeprazole (PRILOSEC) 20 MG capsule [id number] Yes Take 1 capsule (20 mg total) by mouth daily. 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   Dysphonia 06/28/2018   Vitamin D deficiency 04/19/2018   Factor 5 Leiden mutation, heterozygous ([place]) 04/19/2018   Menorrhagia 02/03/2018   Anemia 01/25/2018   Tension headache 01/25/2018   Rash 01/26/2017   Anxiety 11/23/2016   Asthma 11/23/2016   BMI 34.0-34.9,adult 11/23/2016   GERD (gastroesophageal reflux disease) 11/23/2016   Abnormal cervical Papanicolaou smear 08/31/1995    Conditions to be addressed/monitored: Pulmonary Disease and obesity  Care Plan : Medication Management  Updates made by De Hollingshead, RPH-CPP since 02/24/2021 12:00 AM     Problem: Obesity, Asthma, GERD      Long-Range Goal: Disease Progression Prevent   Start Date: 02/24/2021  This Visit's Progress: On track  Priority: High  Note:   Current Barriers:  Unable to achieve control of weight   Pharmacist Clinical Goal(s):  Over the next 90 days, patient will achieve control of weight through collaboration with PharmD and provider.    Interventions: 1:1 collaboration with [name], MD regarding development and update of comprehensive plan of care as evidenced by provider attestation and co-signature Inter-disciplinary care team collaboration (see longitudinal plan of care) Comprehensive medication review performed; medication list updated in electronic medical record  Obesity: Unable to lose weight through diet and exercise alone; current treatment: none; Previously on phentermine, reported constipation Baseline weight: 196 lbs Current meal patterns: breakfast: sometimes skips, sugar free lattes made at home (coffee w/ 2% milk, sugar free caramel syrup); lunch: grilled chicken sandwich, side salad; salads, sweet potatoes and potatoes; does like red meat; dinner: pizza, dinner - dinner probably the most difficult; drinks: sprite zero, half and half tea, regular sprite. Some  unsweet tea. Trying to focus on increased hydration.  Current exercise: has a used Peloton, but has not used lately. Will walk sometimes at [facility], graveyard.  Extensive dietary discussion. Praised for choosing vegetables as sides. Discussed lower calorie options for salad dressings. Encouraged focus on lean proteins, fresh fruits and vegetables, whole grains and more fiber intake. Patient plans on increasing hydration.  Discussed physical activity. Discussed eventual goal of 150 minutes of moderate intensity exercise daily. Discussed setting an attainable goal and increasing over time. Counseled on GLP1 agonists, including mechanism of action, side effects, and benefits. No personal or family history of medullary thyroid cancer, personal history of pancreatitis or gallbladder disease. Counseled on potential side effects of nausea, stomach upset, queasiness, constipation, and that these generally improve over time. Advised to contact our office with more severe symptoms, including nausea, diarrhea, stomach pain. Patient verbalized understanding. Unfortunately, patient does not have prescription insurance, and is over income for GLP1 assistance from the manufacturer, so there is no cost effective way to attain the medication at this time.  Discussed use of metformin. Counseled on side effects of  GI upset, including diarrhea, that generally improves after a week. Patient interested. Start metformin XR 500 mg BID with food. Patient verbalizes understanding.   Asthma: Controlled; current treatment: Symbicort 80/4.5 mcg 2 puffs BID, albuterol HFA PRN (1-2 times weekly); montelukast 10 mg daily Recommend to continue current regimen at this time  Depression/Anxiety Improved per patient report with dose increase; current treatment: sertraline 100 mg daily;  Continue current regimen at this time  ADHD: Controlled per patient report; amphetamine/dextroamphetamine 20 mg PRN; reports she uses when her  focus starts waning Continue current regimen at this time. Monitor for appropriate use, increased HR/BP and CV risk of stimulant  GERD: Controlled per patient report; current regimen: omeprazole 20 mg daily Continue current regimen at this time along with avoidance of trigger foods. Will evaluate appropriateness of de-escalation of therapy moving forward  Health Maintenance: Contraception: Mirena IUD  Patient Goals/Self-Care Activities Over the next 90 days, patient will:  - take medications as prescribed target a minimum of 150 minutes of moderate intensity exercise weekly engage in dietary modifications by reducing carbohydrate/complex sugar intake  Follow Up Plan: Telephone follow up appointment with care management team member scheduled for: ~ 6 weeks      Medication Assistance:   No options for affordability at this time given patient income  Follow Up:  Patient agrees to Care Plan and Follow-up.  Plan: Telephone follow up appointment with care management team member scheduled for:  ~ 6 weeks  [name], PharmD, [place], [place] Clinical Pharmacist [employer] at [employer] [phone]

## 2021-03-24 ENCOUNTER — Other Ambulatory Visit: Payer: Self-pay | Admitting: Family Medicine

## 2021-04-09 ENCOUNTER — Ambulatory Visit: Payer: No Typology Code available for payment source | Admitting: Pharmacist

## 2021-04-09 DIAGNOSIS — Z6834 Body mass index (BMI) 34.0-34.9, adult: Secondary | ICD-10-CM

## 2021-04-09 DIAGNOSIS — J4540 Moderate persistent asthma, uncomplicated: Secondary | ICD-10-CM

## 2021-04-09 NOTE — Patient Instructions (Signed)
Visit Information   Goals Addressed               This Visit's Progress     Patient Stated     Health Management (pt-stated)        Patient Goals/Self-Care Activities Over the next 90 days, patient will:  - take medications as prescribed target a minimum of 150 minutes of moderate intensity exercise weekly engage in dietary modifications by reducing carbohydrate/complex sugar intake        Patient verbalizes understanding of instructions provided today and agrees to view in [place].    Plan: patient will reach out to me moving forward for follow up  [name], PharmD, [place], CPP Clinical Pharmacist [employer] at [employer] [phone]

## 2021-04-09 NOTE — Chronic Care Management (AMB) (Signed)
Care Management   Pharmacy Note  04/09/2021 Name: Kelly [name] MRN: [id number] DOB: [birth year]  Subjective: Kelly [name] is a 45 y.o. year old female who is a primary care patient of [name], MD. The Care Management team was consulted for assistance with care management and care coordination needs.    Engaged with patient by telephone for follow up visit in response to provider referral for pharmacy case management and/or care coordination services.   The patient was given information about Care Management services today including:  Care Management services includes personalized support from designated clinical staff supervised by the patient's primary care provider, including individualized plan of care and coordination with other care providers. 24/7 contact phone numbers for assistance for urgent and routine care needs. The patient may stop case management services at any time by phone call to the office staff.  Patient agreed to services and consent obtained.  Assessment:  Review of patient status, including review of consultants reports, laboratory and other test data, was performed as part of comprehensive evaluation and provision of chronic care management services.   SDOH (Social Determinants of Health) assessments and interventions performed:  SDOH Interventions    Flowsheet Row Most Recent Value  SDOH Interventions   Financial Strain Interventions Other (Comment)  [discussed medication access options]        Objective:  Lab Results  Component Value Date   CREATININE 0.75 02/05/2021   CREATININE 0.76 11/12/2019   CREATININE 0.84 01/12/2019    Lab Results  Component Value Date   HGBA1C 5.4 11/12/2019       Component Value Date/Time   CHOL 176 02/05/2021 1439   TRIG 253.0 (H) 02/05/2021 1439   HDL 43.00 02/05/2021 1439   CHOLHDL 4 02/05/2021 1439   VLDL 50.6 (H) 02/05/2021 1439   LDLDIRECT 115.0 02/05/2021 1439     Clinical ASCVD: No  The  10-year ASCVD risk score [name] DC Jr., et al., 2013) is: 0.9%   Values used to calculate the score:     Age: 53 years     Sex: Female     Is Non-Hispanic African American: No     Diabetic: No     Tobacco smoker: No     Systolic Blood Pressure: [id number] mmHg     Is BP treated: No     HDL Cholesterol: 43 mg/dL     Total Cholesterol: 176 mg/dL      BP Readings from Last 3 Encounters:  01/20/21 130/78  06/25/20 124/82  01/11/20 (!) 132/91    Care Plan  No Known Allergies  Medications Reviewed Today     Reviewed by De Hollingshead, RPH-CPP (Pharmacist) on 02/24/21 at 0904  Med List Status: <None>   Medication Order Taking? Sig Documenting Provider Last Dose Status Informant  albuterol (PROVENTIL HFA;VENTOLIN HFA) 108 (90 Base) MCG/ACT inhaler [id number] Yes INHALE 2 PUFFS BY MOUTH INTO THE LUNGS EVERY 6 HOURS AS NEEDED FOR WHEEZING OR SHORTNESS OF BREATH Shambley, Melody N, CNM Taking Active            Med Note [name], Lathon Adan E   Tue Feb 24, 2021  9:03 AM) Taking ~ 1-2 times weekly  amphetamine-dextroamphetamine (ADDERALL) 20 MG tablet [id number] Yes Take 20 mg by mouth as needed. One tablet daily as needed [provider] Taking Active Self  budesonide-formoterol (SYMBICORT) 80-4.5 MCG/ACT inhaler [id number] Yes INHALE 2 PUFFS INTO THE LUNGS TWICE DAILY [name], MD Taking Active   levonorgestrel (MIRENA) 20  MCG/24HR IUD [id number] Yes 1 each by Intrauterine route once. [provider] Taking Active   montelukast (SINGULAIR) 10 MG tablet [id number] Yes TAKE 1 TABLET BY MOUTH AT BEDTIME [name], MD Taking Active   omeprazole (PRILOSEC) 20 MG capsule [id number] Yes Take 1 capsule (20 mg total) by mouth daily. 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   Dysphonia 06/28/2018   Vitamin D deficiency 04/19/2018   Factor 5 Leiden mutation, heterozygous ([place]) 04/19/2018   Menorrhagia 02/03/2018   Anemia 01/25/2018   Tension headache 01/25/2018   Rash 01/26/2017   Anxiety 11/23/2016   Asthma 11/23/2016   BMI 34.0-34.9,adult 11/23/2016   GERD (gastroesophageal reflux disease) 11/23/2016   Abnormal cervical Papanicolaou smear 08/31/1995    Conditions to be addressed/monitored: Pulmonary Disease and Obesity  Care Plan : Medication Management  Updates made by De Hollingshead, RPH-CPP since 04/09/2021 12:00 AM     Problem: Obesity, Asthma, GERD      Long-Range Goal: Disease Progression Prevent   Start Date: 02/24/2021  This Visit's Progress: On track  Recent Progress: On track  Priority: High  Note:   Current Barriers:  Unable to achieve control of weight   Pharmacist Clinical Goal(s):  Over the next 90 days, patient will achieve control of weight through collaboration with PharmD and provider.    Interventions: 1:1 collaboration with [name], MD regarding development and update of comprehensive plan of care as evidenced by provider attestation and co-signature Inter-disciplinary care team collaboration (see longitudinal plan of care) Comprehensive medication review performed; medication list updated in electronic medical record  Obesity: Unable to lose weight through diet and exercise alone; current treatment: metformin XR 500 mg BID Previously on phentermine, reported constipation Baseline weight: 196 lbs Current meal patterns: breakfast: sometimes skips, sugar free lattes made at home (coffee w/ 2% milk, sugar free caramel syrup); lunch: grilled chicken sandwich, side salad; salads, sweet potatoes and potatoes; does like red meat; dinner: pizza, dinner - dinner probably the most difficult; drinks: sprite  zero, half and half tea, regular sprite. Some unsweet tea. Trying to focus on increased hydration.  Current exercise: has a used Peloton, but has not used lately. Will walk sometimes at [facility] graveyard.  Denies benefit with metformin therapy. Notes that she and her husband plan to look into other insurance plans during open enrollment with prescription coverage. Requests the names of medications we have considered and could consider moving forward. She will reach back out to me in the future when insurance is in place.  Asthma: Controlled; current treatment: Symbicort 80/4.5 mcg 2 puffs BID, albuterol HFA PRN (1-2 times weekly); montelukast 10 mg daily Previously recommended to continue current regimen at this time  Depression/Anxiety Improved; current treatment: sertraline 100 mg daily;  Previously recommended to continue current regimen at this time  ADHD: Controlled per patient report; amphetamine/dextroamphetamine 20 mg PRN; reports she uses when her focus starts waning Previously recommended to continue current regimen at this time. Monitor for appropriate use, increased HR/BP and CV risk of stimulant  GERD: Controlled per patient report; current regimen: omeprazole 20 mg daily Previously  recommended to continue current regimen at this time along with avoidance of trigger foods  Health Maintenance: Contraception: Mirena IUD  Patient Goals/Self-Care Activities Over the next 90 days, patient will:  - take medications as prescribed target a minimum of 150 minutes of moderate intensity exercise weekly engage in dietary modifications by reducing carbohydrate/complex sugar intake  Follow Up Plan: patient will reach out in the future when ready for follow up      Medication Assistance:  None required.  Patient affirms current coverage meets needs.  Plan: patient will reach out to me moving forward for follow up  [name], PharmD, [place], CPP Clinical  Pharmacist [employer] at [employer] [phone]

## 2021-04-10 ENCOUNTER — Ambulatory Visit: Payer: No Typology Code available for payment source | Admitting: Pharmacist

## 2021-04-10 DIAGNOSIS — K219 Gastro-esophageal reflux disease without esophagitis: Secondary | ICD-10-CM

## 2021-04-10 DIAGNOSIS — Z6834 Body mass index (BMI) 34.0-34.9, adult: Secondary | ICD-10-CM

## 2021-04-10 DIAGNOSIS — J4540 Moderate persistent asthma, uncomplicated: Secondary | ICD-10-CM

## 2021-04-10 MED ORDER — TIRZEPATIDE 5 MG/0.5ML ~~LOC~~ SOAJ: 5.0000 mg | SUBCUTANEOUS | 2 refills | Status: DC

## 2021-04-10 MED ORDER — TIRZEPATIDE 2.5 MG/0.5ML ~~LOC~~ SOAJ: 2.5000 mg | SUBCUTANEOUS | 0 refills | Status: AC

## 2021-04-10 NOTE — Addendum Note (Signed)
Addended by: De Hollingshead on: 04/10/2021 03:42 PM   Modules accepted: Orders

## 2021-04-10 NOTE — Patient Instructions (Addendum)
Mickenzie,   Stop metformin. Start Mounjaro. This medication may cause stomach upset, queasiness, or constipation, especially when first starting. This generally improves over time. Call our office if these symptoms occur and worsen, or if you have severe symptoms such as vomiting, diarrhea, or stomach pain.   I've scheduled a follow up call in about 6 weeks to see how you are doing.   Take care!  [name], PharmD [phone]  Visit Information  PATIENT GOALS:  Goals Addressed               This Visit's Progress     Patient Stated     Health Management (pt-stated)        Patient Goals/Self-Care Activities Over the next 90 days, patient will:  - take medications as prescribed target a minimum of 150 minutes of moderate intensity exercise weekly engage in dietary modifications by reducing carbohydrate/complex sugar intake         Patient verbalizes understanding of instructions provided today and agrees to view in [place].   Plan: Telephone follow up appointment with care management team member scheduled for:  ~ 6 weeks  [name], PharmD, [place], [place] Clinical Pharmacist [employer] at [facility] [employer] [facility] [phone]

## 2021-04-10 NOTE — Chronic Care Management (AMB) (Signed)
Care Management   Pharmacy Note  04/10/2021 Name: Kelly [name] MRN: [id number] DOB: [birth year]  Subjective: Kelly [name] is a 45 y.o. year old female who is a primary care patient of Kelly Pheasant, MD. The Care Management team was consulted for assistance with care management and care coordination needs.    Engaged with patient by telephone for follow up visit in response to provider referral for pharmacy case management and/or care coordination services.   The patient was given information about Care Management services today including:  Care Management services includes personalized support from designated clinical staff supervised by the patient's primary care provider, including individualized plan of care and coordination with other care providers. 24/7 contact phone numbers for assistance for urgent and routine care needs. The patient may stop case management services at any time by phone call to the office staff.  Patient agreed to services and consent obtained.  Assessment:  Review of patient status, including review of consultants reports, laboratory and other test data, was performed as part of comprehensive evaluation and provision of chronic care management services.   SDOH (Social Determinants of Health) assessments and interventions performed:    Objective:  Lab Results  Component Value Date   CREATININE 0.75 02/05/2021   CREATININE 0.76 11/12/2019   CREATININE 0.84 01/12/2019    Lab Results  Component Value Date   HGBA1C 5.4 11/12/2019       Component Value Date/Time   CHOL 176 02/05/2021 1439   TRIG 253.0 (H) 02/05/2021 1439   HDL 43.00 02/05/2021 1439   CHOLHDL 4 02/05/2021 1439   VLDL 50.6 (H) 02/05/2021 1439   LDLDIRECT 115.0 02/05/2021 1439    Clinical ASCVD: No  The 10-year ASCVD risk score Kelly [name] DC Jr., et al., 2013) is: 0.9%   Values used to calculate the score:     Age: 67 years     Sex: Female     Is Non-Hispanic African American:  No     Diabetic: No     Tobacco smoker: No     Systolic Blood Pressure: [id number] mmHg     Is BP treated: No     HDL Cholesterol: 43 mg/dL     Total Cholesterol: 176 mg/dL      BP Readings from Last 3 Encounters:  01/20/21 130/78  06/25/20 124/82  01/11/20 (!) 132/91    Care Plan  No Known Allergies  Medications Reviewed Today     Reviewed by De Hollingshead, RPH-CPP (Pharmacist) on 02/24/21 at [employer]  Med List Status: <None>   Medication Order Taking? Sig Documenting Provider Last Dose Status Informant  albuterol (PROVENTIL HFA;VENTOLIN HFA) 108 (90 Base) MCG/ACT inhaler [id number] Yes INHALE 2 PUFFS BY MOUTH INTO THE LUNGS EVERY 6 HOURS AS NEEDED FOR WHEEZING OR SHORTNESS OF BREATH Shambley, Melody N, CNM Taking Active            Med Note [name], Konya Fauble E   Tue Feb 24, 2021  9:03 AM) Taking ~ 1-2 times weekly  amphetamine-dextroamphetamine (ADDERALL) 20 MG tablet [id number] Yes Take 20 mg by mouth as needed. One tablet daily as needed [provider] Taking Active Self  budesonide-formoterol (SYMBICORT) 80-4.5 MCG/ACT inhaler [id number] Yes INHALE 2 PUFFS INTO THE LUNGS TWICE DAILY [name], MD Taking Active   levonorgestrel (MIRENA) 20 MCG/24HR IUD [id number] Yes 1 each by Intrauterine route once. [provider] Taking Active   montelukast (SINGULAIR) 10 MG tablet [id number] Yes TAKE 1 TABLET BY MOUTH AT BEDTIME  [name], MD Taking Active   omeprazole (PRILOSEC) 20 MG capsule [id number] Yes Take 1 capsule (20 mg total) by mouth daily. 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   Dysphonia 06/28/2018   Vitamin D deficiency 04/19/2018    Factor 5 Leiden mutation, heterozygous ([place]) 04/19/2018   Menorrhagia 02/03/2018   Anemia 01/25/2018   Tension headache 01/25/2018   Rash 01/26/2017   Anxiety 11/23/2016   Asthma 11/23/2016   BMI 34.0-34.9,adult 11/23/2016   GERD (gastroesophageal reflux disease) 11/23/2016   Abnormal cervical Papanicolaou smear 08/31/1995    Conditions to be addressed/monitored:  Obesity, Asthma, GERD  Care Plan : Medication Management  Updates made by De Hollingshead, RPH-CPP since 04/10/2021 12:00 AM     Problem: Obesity, Asthma, GERD      Long-Range Goal: Disease Progression Prevent   Start Date: 02/24/2021  This Visit's Progress: On track  Recent Progress: On track  Priority: High  Note:   Current Barriers:  Unable to achieve control of weight   Pharmacist Clinical Goal(s):  Over the next 90 days, patient will achieve control of weight through collaboration with PharmD and provider.    Interventions: 1:1 collaboration with Kelly Pheasant, MD regarding development and update of comprehensive plan of care as evidenced by provider attestation and co-signature Inter-disciplinary care team collaboration (see longitudinal plan of care) Comprehensive medication review performed; medication list updated in electronic medical record  Obesity: Unable to lose weight through diet and exercise alone; current treatment: metformin XR 500 mg BID Previously on phentermine, reported constipation Baseline weight: 196 lbs Patient notes that she spoke with her retail pharmacist and that they think the Mounjaro savings card would work in [employer] situation. Counseled on GLP1 agonists, including mechanism of action, side effects, and benefits. No personal or family history of medullary thyroid cancer, personal history of pancreatitis or gallbladder disease. Counseled on potential side effects of nausea, stomach upset, queasiness, constipation, and that these generally improve over time. Advised to  contact our office with more severe symptoms, including nausea, diarrhea, stomach pain. Patient verbalized understanding. Start Mounjaro 2.5 mg weekly for 4 weeks then increase to 5 mg weekly. Providing sample of 2.5 mg strength to start. Patient will come by the office today for education STOP metformin due to lack of benefit  Asthma: Controlled; current treatment: Symbicort 80/4.5 mcg 2 puffs BID, albuterol HFA PRN (1-2 times weekly); montelukast 10 mg daily Previously recommended to continue current regimen at this time  Depression/Anxiety Improved; current treatment: sertraline 100 mg daily;  Previously recommended to continue current regimen at this time  ADHD: Controlled per patient report; amphetamine/dextroamphetamine 20 mg PRN; reports she uses when her focus starts waning Previously recommended to continue current regimen at this time. Monitor for appropriate use, increased HR/BP and CV risk of stimulant  GERD: Controlled per patient report; current regimen: omeprazole 20 mg daily Previously recommended to continue current regimen at this time along with avoidance of trigger foods  Health Maintenance: Contraception: Mirena IUD  Patient Goals/Self-Care Activities Over the next 90 days, patient will:  - take medications as prescribed target a minimum of  150 minutes of moderate intensity exercise weekly engage in dietary modifications by reducing carbohydrate/complex sugar intake  Follow Up Plan: telephone scheduled for ~ 6 weeks      Medication Assistance:  None required.  Patient affirms current coverage meets needs.  Follow Up:  Patient agrees to Care Plan and Follow-up.  Plan: Telephone follow up appointment with care management team member scheduled for:  ~ 6 weeks  [name], PharmD, [place], CPP Clinical Pharmacist [place] at [facility] [phone]  Medication Samples have been provided to the patient.  Drug name: Darcel Bayley        Strength: 2.5 mg        Qty: 1 box  LOT: [id number]  Exp.Date: 06/02/22

## 2021-05-14 ENCOUNTER — Telehealth: Payer: Self-pay | Admitting: Internal Medicine

## 2021-05-14 ENCOUNTER — Other Ambulatory Visit: Payer: Self-pay

## 2021-05-14 ENCOUNTER — Ambulatory Visit (INDEPENDENT_AMBULATORY_CARE_PROVIDER_SITE_OTHER): Payer: No Typology Code available for payment source | Admitting: Internal Medicine

## 2021-05-14 ENCOUNTER — Encounter: Payer: Self-pay | Admitting: Internal Medicine

## 2021-05-14 VITALS — BP 124/78 | HR 78 | Temp 97.8°F | Resp 16 | Ht 63.0 in | Wt 192.0 lb

## 2021-05-14 DIAGNOSIS — J4540 Moderate persistent asthma, uncomplicated: Secondary | ICD-10-CM | POA: Diagnosis not present

## 2021-05-14 DIAGNOSIS — D6851 Activated protein C resistance: Secondary | ICD-10-CM

## 2021-05-14 DIAGNOSIS — E7800 Pure hypercholesterolemia, unspecified: Secondary | ICD-10-CM

## 2021-05-14 DIAGNOSIS — K219 Gastro-esophageal reflux disease without esophagitis: Secondary | ICD-10-CM | POA: Diagnosis not present

## 2021-05-14 DIAGNOSIS — Z975 Presence of (intrauterine) contraceptive device: Secondary | ICD-10-CM

## 2021-05-14 DIAGNOSIS — Z0000 Encounter for general adult medical examination without abnormal findings: Secondary | ICD-10-CM

## 2021-05-14 DIAGNOSIS — D649 Anemia, unspecified: Secondary | ICD-10-CM

## 2021-05-14 DIAGNOSIS — F419 Anxiety disorder, unspecified: Secondary | ICD-10-CM

## 2021-05-14 DIAGNOSIS — F909 Attention-deficit hyperactivity disorder, unspecified type: Secondary | ICD-10-CM

## 2021-05-14 MED ORDER — OMEPRAZOLE 20 MG PO CPDR: 20.0000 mg | DELAYED_RELEASE_CAPSULE | Freq: Every day | ORAL | 3 refills | Status: DC

## 2021-05-14 NOTE — Telephone Encounter (Signed)
Patient scheduled for labs in 2 weeks as requested by check out note.   Needing orders placed.

## 2021-05-14 NOTE — Progress Notes (Signed)
Patient ID: Kelly [name], female   DOB: 1976-01-25, 44 y.o.   MRN: [id number]   Subjective:    Patient ID: Kelly [name], female    DOB: Jan 19, 1976, 45 y.o.   MRN: [id number]  This visit occurred during the SARS-CoV-2 public health emergency.  Safety protocols were in place, including screening questions prior to the visit, additional usage of staff PPE, and extensive cleaning of exam room while observing appropriate contact time as indicated for disinfecting solutions.   Patient here for physical exam.   Chief Complaint  Patient presents with   Annual Exam   .   HPI She is doing relatively well. Just started mounjaro.  Is walking.  Increased water intake.  Discussed diet and exercise.  No chest pain or sob reported.  Has mirena IUD.  On zoloft.  Appears to be handling stress.    Past Medical History:  Diagnosis Date   ADHD    Allergy    Anxiety    Asthma    Fatigue    GERD (gastroesophageal reflux disease)    Past Surgical History:  Procedure Laterality Date   LAMINECTOMY     Family History  Problem Relation Age of Onset   Hypertension Mother    Non-Hodgkin's lymphoma Father    Lymphoma Father    Cancer Father    Cancer - Cervical Father    Breast cancer Maternal Aunt    Colon polyps Neg Hx    Colon cancer Neg Hx    Rectal cancer Neg Hx    Stomach cancer Neg Hx    Social History   Socioeconomic History   Marital status: Married    Spouse name: Not on file   Number of children: Not on file   Years of education: Not on file   Highest education level: Not on file  Occupational History   Not on file  Tobacco Use   Smoking status: Never   Smokeless tobacco: Never  Vaping Use   Vaping Use: Never used  Substance and Sexual Activity   Alcohol use: No   Drug use: No   Sexual activity: Yes    Birth control/protection: I.U.D.    Comment: mierna  Other Topics Concern   Not on file  Social History Narrative   Not on file   Social Determinants of Health    Financial Resource Strain: Medium Risk   Difficulty of Paying Living Expenses: Somewhat hard  Food Insecurity: Not on file  Transportation Needs: Not on file  Physical Activity: Not on file  Stress: Not on file  Social Connections: Not on file     Review of Systems  Constitutional:  Negative for appetite change and unexpected weight change.       Some fatigue.   HENT:  Negative for congestion, sinus pressure and sore throat.   Eyes:  Negative for pain and visual disturbance.  Respiratory:  Negative for cough, chest tightness and shortness of breath.   Cardiovascular:  Negative for chest pain, palpitations and leg swelling.  Gastrointestinal:  Negative for abdominal pain, constipation and diarrhea.  Genitourinary:  Negative for difficulty urinating and dysuria.  Musculoskeletal:  Negative for back pain and joint swelling.  Skin:  Negative for color change and rash.  Neurological:  Negative for dizziness, light-headedness and headaches.  Hematological:  Negative for adenopathy. Does not bruise/bleed easily.  Psychiatric/Behavioral:  Negative for decreased concentration and dysphoric mood.       Objective:     BP 124/78  Pulse 78   Temp 97.8 F (36.6 C)   Resp 16   Ht '5\' 3"'$  (1.6 m)   Wt 192 lb (87.1 kg)   SpO2 98%   BMI 34.01 kg/m  Wt Readings from Last 3 Encounters:  05/14/21 192 lb (87.1 kg)  01/20/21 196 lb 12.8 oz (89.3 kg)  06/25/20 192 lb 3.2 oz (87.2 kg)    Physical Exam Vitals reviewed.  Constitutional:      General: She is not in acute distress.    Appearance: Normal appearance. She is well-developed.  HENT:     Head: Normocephalic and atraumatic.     Right Ear: External ear normal.     Left Ear: External ear normal.  Eyes:     General: No scleral icterus.       Right eye: No discharge.        Left eye: No discharge.     Conjunctiva/sclera: Conjunctivae normal.  Neck:     Thyroid: No thyromegaly.  Cardiovascular:     Rate and Rhythm: Normal  rate and regular rhythm.  Pulmonary:     Effort: No tachypnea, accessory muscle usage or respiratory distress.     Breath sounds: Normal breath sounds. No decreased breath sounds or wheezing.  Chest:  Breasts:    Right: No inverted nipple, mass, nipple discharge or tenderness (no axillary adenopathy).     Left: No inverted nipple, mass, nipple discharge or tenderness (no axilarry adenopathy).  Abdominal:     General: Bowel sounds are normal.     Palpations: Abdomen is soft.     Tenderness: There is no abdominal tenderness.  Musculoskeletal:        General: No swelling or tenderness.     Cervical back: Neck supple.  Lymphadenopathy:     Cervical: No cervical adenopathy.  Skin:    Findings: No erythema or rash.  Neurological:     Mental Status: She is alert and oriented to person, place, and time.  Psychiatric:        Mood and Affect: Mood normal.        Behavior: Behavior normal.     Outpatient Encounter Medications as of 05/14/2021  Medication Sig   albuterol (PROVENTIL HFA;VENTOLIN HFA) 108 (90 Base) MCG/ACT inhaler INHALE 2 PUFFS BY MOUTH INTO THE LUNGS EVERY 6 HOURS AS NEEDED FOR WHEEZING OR SHORTNESS OF BREATH   amphetamine-dextroamphetamine (ADDERALL) 20 MG tablet Take 20 mg by mouth as needed. One tablet daily as needed   budesonide-formoterol (SYMBICORT) 80-4.5 MCG/ACT inhaler INHALE 2 PUFFS INTO THE LUNGS TWICE DAILY   cetirizine (ZYRTEC) 10 MG tablet Take 10 mg by mouth daily.   levonorgestrel (MIRENA) 20 MCG/24HR IUD 1 each by Intrauterine route once.   montelukast (SINGULAIR) 10 MG tablet TAKE 1 TABLET BY MOUTH AT BEDTIME   sertraline (ZOLOFT) 100 MG tablet TAKE 1 TABLET BY MOUTH ONCE A DAY   tirzepatide (MOUNJARO) 5 MG/0.5ML Pen Inject 5 mg into the skin once a week.   [DISCONTINUED] omeprazole (PRILOSEC) 20 MG capsule Take 1 capsule (20 mg total) by mouth daily.   omeprazole (PRILOSEC) 20 MG capsule Take 1 capsule (20 mg total) by mouth daily.   No  facility-administered encounter medications on file as of 05/14/2021.     Lab Results  Component Value Date   WBC 7.6 02/05/2021   HGB 14.4 02/05/2021   HCT 42.6 02/05/2021   PLT 219.0 02/05/2021   GLUCOSE 91 02/05/2021   CHOL 176 02/05/2021   TRIG 253.0 (  H) 02/05/2021   HDL 43.00 02/05/2021   LDLDIRECT 115.0 02/05/2021   ALT 15 02/05/2021   AST 14 02/05/2021   NA 140 02/05/2021   K 3.9 02/05/2021   CL 106 02/05/2021   CREATININE 0.75 02/05/2021   BUN 14 02/05/2021   CO2 26 02/05/2021   TSH 1.95 02/05/2021   INR 1.0 04/12/2018   HGBA1C 5.4 11/12/2019    MM DIAG BREAST TOMO UNI LEFT  Result Date: 02/04/2021 CLINICAL DATA:  Callback from screening mammogram for possible asymmetry left breast EXAM: DIGITAL DIAGNOSTIC UNILATERAL LEFT MAMMOGRAM WITH TOMOSYNTHESIS AND CAD TECHNIQUE: Left digital diagnostic mammography and breast tomosynthesis was performed. The images were evaluated with computer-aided detection. COMPARISON:  Prior films ACR Breast Density Category b: There are scattered areas of fibroglandular density. FINDINGS: Lateral view of left breast, spot compression left cc view are submitted. The previously questioned asymmetry does not persist on additional views. No suspicious abnormality is identified. IMPRESSION: Negative. RECOMMENDATION: Routine screening mammogram back schedule. I have discussed the findings and recommendations with the patient. If applicable, a reminder letter will be sent to the patient regarding the next appointment. BI-RADS CATEGORY  1: Negative. Electronically Signed   By: Abelardo Diesel M.D.   On: 02/04/2021 15:31      Assessment & [name]:   Problem List Items Addressed This Visit     ADHD    Has been worked up and diagnosed with ADHD.  On adderall.  Does not take daily.  Follow.       Anemia    Follow cbc.       Anxiety    Continue zoloft.  Overall appears to be handling things relatively well.  Follow.       Asthma    Continues on  symbicort and singulair.  Breathing stable.  Follow.       Factor 5 Leiden mutation, heterozygous [facility]    Has a documented history of this mutation.  Follow.       GERD (gastroesophageal reflux disease)    No upper symptoms reported.  On omeprazole.  S/p EGD 12/21/19      Relevant Medications   omeprazole (PRILOSEC) 20 MG capsule   Hypercholesterolemia    Is walking.  Discussed diet and exercise.  Follow lipid panel.       Relevant Orders   Comprehensive metabolic panel   Lipid panel   IUD (intrauterine device) in place    Has IUD in place.   Followed by gyn.       Other Visit Diagnoses     Routine general medical examination at a health care facility    -  Primary        [name], MD

## 2021-05-16 DIAGNOSIS — E7800 Pure hypercholesterolemia, unspecified: Secondary | ICD-10-CM | POA: Insufficient documentation

## 2021-05-16 NOTE — Telephone Encounter (Signed)
Orders placed for labs

## 2021-05-23 ENCOUNTER — Encounter: Payer: Self-pay | Admitting: Internal Medicine

## 2021-05-23 NOTE — Assessment & Plan Note (Signed)
Continue zoloft.  Overall appears to be handling things relatively well.  Follow.  

## 2021-05-23 NOTE — Assessment & Plan Note (Signed)
Continues on symbicort and singulair.  Breathing stable.  Follow.  

## 2021-05-23 NOTE — Assessment & Plan Note (Signed)
No upper symptoms reported.  On omeprazole.  S/p EGD 12/21/19 

## 2021-05-23 NOTE — Assessment & Plan Note (Signed)
Is walking.  Discussed diet and exercise.  Follow lipid panel.

## 2021-05-23 NOTE — Assessment & Plan Note (Signed)
Has a documented history of this mutation.  Follow.

## 2021-05-23 NOTE — Assessment & Plan Note (Signed)
Follow cbc.  

## 2021-05-23 NOTE — Assessment & Plan Note (Signed)
Has been worked up and diagnosed with ADHD.  On adderall.  Does not take daily.  Follow.

## 2021-05-23 NOTE — Assessment & Plan Note (Signed)
Has IUD in place.   Followed by gyn.

## 2021-05-25 ENCOUNTER — Telehealth: Payer: No Typology Code available for payment source

## 2021-05-25 ENCOUNTER — Telehealth: Payer: Self-pay | Admitting: Pharmacist

## 2021-05-25 NOTE — Telephone Encounter (Signed)
  Chronic Care Management   Note  05/25/2021 Name: Kelly [name] MRN: [id number] DOB: 04-11-1976   Attempted to contact patient for scheduled appointment for medication management support. Left HIPAA compliant message for patient to return my call at their convenience.    Plan: - If I do not hear back from the patient by end of business today, will collaborate with Care Guide to outreach to schedule follow up with me  [name], PharmD, [place], [place] Pharmacist [employer] at [employer] [phone]

## 2021-05-28 ENCOUNTER — Telehealth: Payer: Self-pay

## 2021-05-28 NOTE — Chronic Care Management (AMB) (Signed)
  Care Management   Note  05/28/2021 Name: Kelly [name] MRN: [id number] DOB: 1976/08/01  Kelly [name] is a 45 y.o. year old female who is a primary care patient of [name], MD and is actively engaged with the care management team. I reached out to [name] by phone today to assist with re-scheduling a follow up visit with the Pharmacist  Follow up plan: Unsuccessful telephone outreach attempt made. A HIPAA compliant phone message was left for the patient providing contact information and requesting a return call.  The care management team will reach out to the patient again over the next 4 days.  If patient returns call to provider office, please advise to call [facility]  at [place], [place], [place],  [address] Direct Dial: [phone] [email or web address]

## 2021-05-28 NOTE — Chronic Care Management (AMB) (Signed)
  Care Management   Note  05/28/2021 Name: [name] MRN: [id number] DOB: 1975-09-15  Kelly [name] is a 45 y.o. year old female who is a primary care patient of [name], MD and is actively engaged with the care management team. I reached out to Kelly [name] by phone today to assist with re-scheduling a follow up visit with the Pharmacist  Follow up [name]: Telephone appointment with care management team member scheduled for:06/04/2021  Noreene Larsson, [place], [place],  [address] Direct Dial: [phone] [email or web address]

## 2021-06-01 ENCOUNTER — Other Ambulatory Visit: Payer: Self-pay | Admitting: Family Medicine

## 2021-06-02 ENCOUNTER — Other Ambulatory Visit: Payer: Self-pay

## 2021-06-02 ENCOUNTER — Other Ambulatory Visit (INDEPENDENT_AMBULATORY_CARE_PROVIDER_SITE_OTHER): Payer: No Typology Code available for payment source

## 2021-06-02 DIAGNOSIS — E7800 Pure hypercholesterolemia, unspecified: Secondary | ICD-10-CM | POA: Diagnosis not present

## 2021-06-02 LAB — LIPID PANEL
Cholesterol: 164 mg/dL (ref 0–200)
HDL: 38.7 mg/dL — ABNORMAL LOW (ref >39.00)
LDL Cholesterol: 108 mg/dL — ABNORMAL HIGH (ref 0–99)
NonHDL: 124.85
Total CHOL/HDL Ratio: 4
Triglycerides: 82 mg/dL (ref 0.0–149.0)
VLDL: 16.4 mg/dL (ref 0.0–40.0)

## 2021-06-02 LAB — COMPREHENSIVE METABOLIC PANEL
ALT: 28 U/L (ref 0–35)
AST: 20 U/L (ref 0–37)
Albumin: 4.2 g/dL (ref 3.5–5.2)
Alkaline Phosphatase: 60 U/L (ref 39–117)
BUN: 13 mg/dL (ref 6–23)
CO2: 26 mEq/L (ref 19–32)
Calcium: 9.5 mg/dL (ref 8.4–10.5)
Chloride: 108 mEq/L (ref 96–112)
Creatinine, Ser: 0.76 mg/dL (ref 0.40–1.20)
GFR: 95.04 mL/min (ref >60.00)
Glucose, Bld: 91 mg/dL (ref 70–99)
Potassium: 4.3 mEq/L (ref 3.5–5.1)
Sodium: 140 mEq/L (ref 135–145)
Total Bilirubin: 0.5 mg/dL (ref 0.2–1.2)
Total Protein: 6.3 g/dL (ref 6.0–8.3)

## 2021-06-02 MED ORDER — SERTRALINE HCL 100 MG PO TABS: 100.0000 mg | ORAL_TABLET | Freq: Every day | ORAL | 1 refills | Status: DC

## 2021-06-03 ENCOUNTER — Telehealth: Payer: Self-pay | Admitting: Internal Medicine

## 2021-06-03 NOTE — Telephone Encounter (Signed)
Patient returned office phone call for lab results. 

## 2021-06-04 ENCOUNTER — Ambulatory Visit: Payer: No Typology Code available for payment source | Admitting: Pharmacist

## 2021-06-04 VITALS — Wt 184.3 lb

## 2021-06-04 DIAGNOSIS — Z6834 Body mass index (BMI) 34.0-34.9, adult: Secondary | ICD-10-CM

## 2021-06-04 MED ORDER — TIRZEPATIDE 7.5 MG/0.5ML ~~LOC~~ SOAJ: 7.5000 mg | SUBCUTANEOUS | 2 refills | Status: DC

## 2021-06-04 NOTE — Chronic Care Management (AMB) (Signed)
Care Management   Pharmacy Note  06/04/2021 Name: Kelly [name] MRN: [id number] DOB: 29-Jul-1976  Subjective: Kelly [name] is a 45 y.o. year old female who is a primary care patient of [name], MD. The Care Management team was consulted for assistance with care management and care coordination needs.    Engaged with patient by telephone for follow up visit in response to provider referral for pharmacy case management and/or care coordination services.   The patient was given information about Care Management services today including:  Care Management services includes personalized support from designated clinical staff supervised by the patient's primary care provider, including individualized plan of care and coordination with other care providers. 24/7 contact phone numbers for assistance for urgent and routine care needs. The patient may stop case management services at any time by phone call to the office staff.  Patient agreed to services and consent obtained.  Assessment:  Review of patient status, including review of consultants reports, laboratory and other test data, was performed as part of comprehensive evaluation and provision of chronic care management services.   SDOH (Social Determinants of Health) assessments and interventions performed:    Objective:  Lab Results  Component Value Date   CREATININE 0.76 06/02/2021   CREATININE 0.75 02/05/2021   CREATININE 0.76 11/12/2019    Lab Results  Component Value Date   HGBA1C 5.4 11/12/2019       Component Value Date/Time   CHOL 164 06/02/2021 0854   TRIG 82.0 06/02/2021 0854   HDL 38.70 (L) 06/02/2021 0854   CHOLHDL 4 06/02/2021 0854   VLDL 16.4 06/02/2021 0854   LDLCALC 108 (H) 06/02/2021 0854   LDLDIRECT 115.0 02/05/2021 1439    BP Readings from Last 3 Encounters:  05/14/21 124/78  01/20/21 130/78  06/25/20 124/82    Care Plan  No Known Allergies  Medications Reviewed Today     Reviewed  by [name], MD (Physician) on 05/23/21 at 0926  Med List Status: <None>   Medication Order Taking? Sig Documenting Provider Last Dose Status Informant  albuterol (PROVENTIL HFA;VENTOLIN HFA) 108 (90 Base) MCG/ACT inhaler [id number] Yes INHALE 2 PUFFS BY MOUTH INTO THE LUNGS EVERY 6 HOURS AS NEEDED FOR WHEEZING OR SHORTNESS OF BREATH Shambley, Melody N, CNM Taking Active            Med Note [name], Stephaney Steven E   Tue Feb 24, 2021  9:03 AM) Taking ~ 1-2 times weekly  amphetamine-dextroamphetamine (ADDERALL) 20 MG tablet [id number] Yes Take 20 mg by mouth as needed. One tablet daily as needed [provider] Taking Active Self  budesonide-formoterol (SYMBICORT) 80-4.5 MCG/ACT inhaler [id number] Yes INHALE 2 PUFFS INTO THE LUNGS TWICE DAILY [name], MD Taking Active   cetirizine (ZYRTEC) 10 MG tablet [id number] Yes Take 10 mg by mouth daily. [provider] Taking Active   levonorgestrel (MIRENA) 20 MCG/24HR IUD [id number] Yes 1 each by Intrauterine route once. [provider] Taking Active   montelukast (SINGULAIR) 10 MG tablet [id number] Yes TAKE 1 TABLET BY MOUTH AT BEDTIME [name], MD Taking Active   omeprazole (PRILOSEC) 20 MG capsule [id number]  Take 1 capsule (20 mg total) by mouth daily. [name], MD  Active   sertraline (ZOLOFT) 100 MG tablet [id number] Yes TAKE 1 TABLET BY MOUTH ONCE A DAY [name], MD Taking Active   tirzepatide [facility]) 5 MG/0.5ML Pen [id number] Yes Inject 5 mg into the skin once a week. 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   Dysphonia 06/28/2018   Vitamin D deficiency 04/19/2018   Factor 5 Leiden mutation, heterozygous ([place]) 04/19/2018   Menorrhagia 02/03/2018   Anemia 01/25/2018    Tension headache 01/25/2018   Rash 01/26/2017   Anxiety 11/23/2016   Asthma 11/23/2016   BMI 34.0-34.9,adult 11/23/2016   GERD (gastroesophageal reflux disease) 11/23/2016   Abnormal cervical Papanicolaou smear 08/31/1995    Conditions to be addressed/monitored:  Obesity, Asthma, GERD  Care Plan : Medication Management  Updates made by De Hollingshead, RPH-CPP since 06/04/2021 12:00 AM     Problem: Obesity, Asthma, GERD      Long-Range Goal: Disease Progression Prevent   Start Date: 02/24/2021  Recent Progress: On track  Priority: High  Note:   Current Barriers:  Unable to achieve control of weight   Pharmacist Clinical Goal(s):  Over the next 90 days, patient will achieve control of weight through collaboration with PharmD and provider.    Interventions: 1:1 collaboration with [name], MD regarding development and update of comprehensive plan of care as evidenced by provider attestation and co-signature Inter-disciplinary care team collaboration (see longitudinal plan of care) Comprehensive medication review performed; medication list updated in electronic medical record  Health Maintenance: Reviewed lab results. Patient verbalized understanding  Obesity: Unable to lose weight through diet and exercise alone; current treatment: Mounjaro 2.5 mg weekly, plans to step up to 5 mg weekly with next does Denies significant GI upset, occasional queasiness. Reports fatigue for the 2 days after injection but improved.  Reports fatigue, nausea is hit or miss  Previously on phentermine, reported constipation Baseline weight: 196 lbs Reviewed importance of lean proteins, fresh fruits and vegetables, whole grains, hydration.  Reviewed importance of physical activity in weight management goals Increase Mounjaro to 5 mg weekly as previously prescribed. Will send order for 7.5 mg weekly to her pharmacy for next fill. Discussed increased risk of GI upset with increased dose.  Patient verbalized understanding  Asthma: Controlled; current treatment: Symbicort 80/4.5 mcg 2 puffs BID, albuterol HFA PRN (1-2 times weekly); montelukast 10 mg daily Previously recommended to continue current regimen at this time  Depression/Anxiety Improved; current treatment: sertraline 100 mg daily;  Previously recommended to continue current regimen at this time  ADHD: Controlled per patient report; amphetamine/dextroamphetamine 20 mg PRN; reports she uses when her focus starts waning Previously recommended to continue current regimen at this time. Monitor for appropriate use, increased HR/BP and CV risk of stimulant  GERD: Controlled per patient report; current regimen: omeprazole 20 mg daily Previously recommended to continue current regimen at this time along with avoidance of trigger foods  Health Maintenance: Contraception: Mirena IUD  Patient Goals/Self-Care Activities Over the next 90 days, patient will:  - take medications as prescribed target a minimum of 150 minutes of moderate intensity exercise weekly engage in dietary modifications by reducing carbohydrate/complex sugar intake  Follow Up Plan: Telephone follow up scheduled for ~ 6 weeks      Medication Assistance:  None required.  Patient affirms current coverage meets needs.  Follow Up:  Patient agrees to Care Plan and Follow-up.  Plan: Telephone follow up appointment with care management team member scheduled for:  6 weeks  [name], PharmD, [place], [place] Clinical Pharmacist [employer] at [employer] [phone]

## 2021-06-04 NOTE — Patient Instructions (Signed)
Visit Information   Goals Addressed               This Visit's Progress     Patient Stated     Health Management (pt-stated)        Patient Goals/Self-Care Activities Over the next 90 days, patient will:  - take medications as prescribed target a minimum of 150 minutes of moderate intensity exercise weekly engage in dietary modifications by reducing carbohydrate/complex sugar intake        Patient verbalizes understanding of instructions provided today and agrees to view in [place].   Plan: Telephone follow up appointment with care management team member scheduled for:  6 weeks  [name], PharmD, [place], [place] Clinical Pharmacist [employer] at [employer] [phone]

## 2021-06-17 ENCOUNTER — Other Ambulatory Visit: Payer: Self-pay | Admitting: Family Medicine

## 2021-06-17 ENCOUNTER — Encounter: Payer: Self-pay | Admitting: Internal Medicine

## 2021-06-18 ENCOUNTER — Other Ambulatory Visit: Payer: Self-pay

## 2021-06-18 MED ORDER — BUDESONIDE-FORMOTEROL FUMARATE 80-4.5 MCG/ACT IN AERO: 2.0000 | INHALATION_SPRAY | Freq: Two times a day (BID) | RESPIRATORY_TRACT | 1 refills | Status: DC

## 2021-06-30 ENCOUNTER — Telehealth: Payer: Self-pay | Admitting: Internal Medicine

## 2021-06-30 NOTE — Telephone Encounter (Signed)
Patient aware of below. Will decrease back down to the 2.5 mg and f/u with catie on 10/23

## 2021-06-30 NOTE — Telephone Encounter (Signed)
Edit: 11/23 appt with Catie

## 2021-06-30 NOTE — Telephone Encounter (Signed)
Pt is requesting to be prescribed Zofran due to nausea. Pt states she received a monogero (apologies if not spelled correctly) shot which causes her to become nauseous as a side effect. [phone]

## 2021-06-30 NOTE — Telephone Encounter (Signed)
Patient started mounjaro about 3 months ago. She has dealt with having nausea about 2-3 days after the injection. She had some nausea with the low dose of mounjaro and was manageable but worsened after she increased to the 5 mg. She is due to increase her dose of mounjaro again this weekend. Was going to hold off on increasing and stay on the 5 mg and would like to have a script for zofran to use prn for the nausea to see if this helps. Confirmed no other symptoms. Ok to send in?

## 2021-06-30 NOTE — Telephone Encounter (Signed)
Would hold on increasing dose of mounjaro.  If having symptoms where she would need to take zofran to be able to continue the medication, may need to decrease dose.  Would hold on zofran rx.  Decrease dose of mounjaro and see if helps symptoms.  Arrange f/u with Catie.

## 2021-07-18 ENCOUNTER — Other Ambulatory Visit: Payer: Self-pay | Admitting: Internal Medicine

## 2021-07-22 ENCOUNTER — Telehealth: Payer: Self-pay | Admitting: Pharmacist

## 2021-07-22 ENCOUNTER — Telehealth: Payer: No Typology Code available for payment source

## 2021-07-22 NOTE — Telephone Encounter (Signed)
  Chronic Care Management   Note  07/22/2021 Name: Kelly [name] MRN: [id number] DOB: June 30, 1976   Attempted to contact patient for scheduled appointment for medication management support. Left HIPAA compliant message for patient to return my call at their convenience.    Plan: - If I do not hear back from the patient by end of business today, will collaborate with Care Guide to outreach to schedule follow up with me  [name], PharmD, [place], [place] Pharmacist [employer] at [employer] [phone]

## 2021-08-04 ENCOUNTER — Ambulatory Visit: Payer: No Typology Code available for payment source | Admitting: Pharmacist

## 2021-08-04 DIAGNOSIS — Z6832 Body mass index (BMI) 32.0-32.9, adult: Secondary | ICD-10-CM

## 2021-08-04 DIAGNOSIS — Z6834 Body mass index (BMI) 34.0-34.9, adult: Secondary | ICD-10-CM

## 2021-08-04 MED ORDER — TIRZEPATIDE 5 MG/0.5ML ~~LOC~~ SOAJ: 5.0000 mg | SUBCUTANEOUS | 2 refills | Status: DC

## 2021-08-04 NOTE — Chronic Care Management (AMB) (Signed)
Chronic Care Management CCM Pharmacy Note  08/04/2021 Name:  Kelly [name] MRN:  [id number] DOB:  01/05/1976  Summary: - Reports tolerability of Mounjaro 5 mg weekly  Recommendations/Changes made from today's visit: - Continue current regimen at this time  Subjective: Kelly [name] is an 45 y.o. year old female who is a primary patient of [name], MD.  The CCM team was consulted for assistance with disease management and care coordination needs.    Engaged with patient by telephone for follow up visit for pharmacy case management and/or care coordination services.   Objective:  Medications Reviewed Today     Reviewed by De Hollingshead, RPH-CPP (Pharmacist) on 08/04/21 at 1347  Med List Status: <None>   Medication Order Taking? Sig Documenting Provider Last Dose Status Informant  albuterol (PROVENTIL HFA;VENTOLIN HFA) 108 (90 Base) MCG/ACT inhaler [id number]  INHALE 2 PUFFS BY MOUTH INTO THE LUNGS EVERY 6 HOURS AS NEEDED FOR WHEEZING OR SHORTNESS OF BREATH Shambley, Melody N, CNM  Active            Med Note [name], Corinn Stoltzfus E   Tue Feb 24, 2021  9:03 AM) Taking ~ 1-2 times weekly  amphetamine-dextroamphetamine (ADDERALL) 20 MG tablet [id number]  Take 20 mg by mouth as needed. One tablet daily as needed [provider]  Active Self  budesonide-formoterol (SYMBICORT) 80-4.5 MCG/ACT inhaler [id number]  INHALE 2 PUFFS INTO THE LUNGS TWICE DAILY [name], MD  Active   cetirizine (ZYRTEC) 10 MG tablet [id number]  Take 10 mg by mouth daily. [provider]  Active   levonorgestrel (MIRENA) 20 MCG/24HR IUD [id number]  1 each by Intrauterine route once. [provider]  Active   montelukast (SINGULAIR) 10 MG tablet [id number]  TAKE 1 TABLET BY MOUTH AT BEDTIME [name], MD  Active   omeprazole (PRILOSEC) 20 MG capsule [id number]  Take 1 capsule (20 mg total) by mouth daily. [name], MD  Active   sertraline (ZOLOFT) 100 MG tablet  [id number]  Take 1 tablet (100 mg total) by mouth daily. [name], MD  Active   tirzepatide [facility]) 5 MG/0.5ML Pen [id number] Yes Inject 5 mg into the skin once a week. [name], MD Taking Active             Pertinent Labs:   Lab Results  Component Value Date   HGBA1C 5.4 11/12/2019   Lab Results  Component Value Date   CHOL 164 06/02/2021   HDL 38.70 (L) 06/02/2021   LDLCALC 108 (H) 06/02/2021   LDLDIRECT 115.0 02/05/2021   TRIG 82.0 06/02/2021   CHOLHDL 4 06/02/2021   Lab Results  Component Value Date   CREATININE 0.76 06/02/2021   BUN 13 06/02/2021   NA 140 06/02/2021   K 4.3 06/02/2021   CL 108 06/02/2021   CO2 26 06/02/2021    SDOH:  (Social Determinants of Health) assessments and interventions performed:  SDOH Interventions    Flowsheet Row Most Recent Value  SDOH Interventions   Financial Strain Interventions Intervention Not Indicated       CCM Care Plan  Review of patient past medical history, allergies, medications, health status, including review of consultants reports, laboratory and other test data, was performed as part of comprehensive evaluation and provision of chronic care management services.   Care Plan : Medication Management  Updates made by De Hollingshead, RPH-CPP since 08/04/2021 12:00 AM     Problem: Obesity, Asthma, GERD      Long-Range  Goal: Disease Progression Prevent   Start Date: 02/24/2021  Recent Progress: On track  Priority: High  Note:   Current Barriers:  Unable to achieve control of weight   Pharmacist Clinical Goal(s):  Over the next 90 days, patient will achieve control of weight through collaboration with PharmD and provider.   Interventions: 1:1 collaboration with [name], MD regarding development and update of comprehensive plan of care as evidenced by provider attestation and co-signature Inter-disciplinary care team collaboration (see longitudinal plan of care) Comprehensive  medication review performed; medication list updated in electronic medical record  Obesity: Unable to lose weight through diet and exercise alone; current treatment: Mounjaro 5 mg weekly Reports better tolerability, decreased nausea with injection into thigh (rather than stomach) Previously on phentermine, reported constipation Baseline weight: 196 lbs; most recent weight: 178 lbs; total weight loss 18 lbs (9% from baseline) Reviewed importance of lean proteins, fresh fruits and vegetables, whole grains, hydration.  Reviewed importance of physical activity in weight management goals Continue Mounjaro 5 mg once weekly. Patient will outreach if she is interested in stepping up to 7.5 mg dose prior to our next call  Asthma: Controlled; current treatment: Symbicort 80/4.5 mcg 2 puffs BID, albuterol HFA PRN (1-2 times weekly); montelukast 10 mg daily Previously recommended to continue current regimen at this time  Depression/Anxiety Improved; current treatment: sertraline 100 mg daily;  Previously recommended to continue current regimen at this time  ADHD: Controlled per patient report; amphetamine/dextroamphetamine 20 mg PRN; reports she uses when her focus starts waning Previously recommended to continue current regimen at this time. Monitor for appropriate use, increased HR/BP and CV risk of stimulant  GERD: Controlled per patient report; current regimen: omeprazole 20 mg daily Previously recommended to continue current regimen at this time along with avoidance of trigger foods  Health Maintenance: Contraception: Mirena IUD  Patient Goals/Self-Care Activities Over the next 90 days, patient will:  - take medications as prescribed target a minimum of 150 minutes of moderate intensity exercise weekly engage in dietary modifications by reducing carbohydrate/complex sugar intake      Plan: Video visit scheduled in ~ 8 weeks  [name], PharmD, [place], CPP Clinical  Pharmacist [employer] at [employer] [phone]

## 2021-08-04 NOTE — Patient Instructions (Signed)
Marykathryn,   Continue Mounjaro 5 mg weekly.   Let us know if you need anything. Take care!  [name], PharmD

## 2021-08-14 ENCOUNTER — Encounter: Payer: Self-pay | Admitting: Internal Medicine

## 2021-08-14 ENCOUNTER — Other Ambulatory Visit: Payer: Self-pay

## 2021-08-14 ENCOUNTER — Ambulatory Visit: Payer: No Typology Code available for payment source | Admitting: Internal Medicine

## 2021-08-14 DIAGNOSIS — J4540 Moderate persistent asthma, uncomplicated: Secondary | ICD-10-CM | POA: Diagnosis not present

## 2021-08-14 DIAGNOSIS — K219 Gastro-esophageal reflux disease without esophagitis: Secondary | ICD-10-CM

## 2021-08-14 DIAGNOSIS — F419 Anxiety disorder, unspecified: Secondary | ICD-10-CM

## 2021-08-14 DIAGNOSIS — Z975 Presence of (intrauterine) contraceptive device: Secondary | ICD-10-CM

## 2021-08-14 DIAGNOSIS — Z8742 Personal history of other diseases of the female genital tract: Secondary | ICD-10-CM

## 2021-08-14 DIAGNOSIS — D649 Anemia, unspecified: Secondary | ICD-10-CM

## 2021-08-14 DIAGNOSIS — E7800 Pure hypercholesterolemia, unspecified: Secondary | ICD-10-CM

## 2021-08-14 DIAGNOSIS — Z6832 Body mass index (BMI) 32.0-32.9, adult: Secondary | ICD-10-CM

## 2021-08-14 DIAGNOSIS — D6851 Activated protein C resistance: Secondary | ICD-10-CM

## 2021-08-14 DIAGNOSIS — F909 Attention-deficit hyperactivity disorder, unspecified type: Secondary | ICD-10-CM

## 2021-08-14 NOTE — Assessment & Plan Note (Signed)
Continue zoloft.  Overall appears to be handling things relatively well.  Follow.  

## 2021-08-14 NOTE — Progress Notes (Signed)
Patient ID: Kelly [name], female   DOB: 1976-05-19, 45 y.o.   MRN: [id number]   Subjective:    Patient ID: Kelly [name], female    DOB: June 01, 1976, 45 y.o.   MRN: [id number]  This visit occurred during the SARS-CoV-2 public health emergency.  Safety protocols were in place, including screening questions prior to the visit, additional usage of staff PPE, and extensive cleaning of exam room while observing appropriate contact time as indicated for disinfecting solutions.   Patient here for a scheduled follow up.    HPI Here to follow up regarding increased stress and weight loss. On mounjaro.  Doing well on the medication  has lost weight.  No GI symptoms.  Discussed continued diet and exercise.  Apparently some issues now with coverage of mounjaro (per Lattie Haw - as of 07/30/21).  No chest pain reported.  Breathing stable.  Doing well on symbicort and singulair.  No abdominal pain or bowel change reported.     Past Medical History:  Diagnosis Date   ADHD    Allergy    Anxiety    Asthma    Fatigue    GERD (gastroesophageal reflux disease)    Past Surgical History:  Procedure Laterality Date   LAMINECTOMY     Family History  Problem Relation Age of Onset   Hypertension Mother    Non-Hodgkin's lymphoma Father    Lymphoma Father    Cancer Father    Cancer - Cervical Father    Breast cancer Maternal Aunt    Colon polyps Neg Hx    Colon cancer Neg Hx    Rectal cancer Neg Hx    Stomach cancer Neg Hx    Social History   Socioeconomic History   Marital status: Married    Spouse name: Not on file   Number of children: Not on file   Years of education: Not on file   Highest education level: Not on file  Occupational History   Not on file  Tobacco Use   Smoking status: Never   Smokeless tobacco: Never  Vaping Use   Vaping Use: Never used  Substance and Sexual Activity   Alcohol use: No   Drug use: No   Sexual activity: Yes    Birth control/protection: I.U.D.    Comment:  mierna  Other Topics Concern   Not on file  Social History Narrative   Not on file   Social Determinants of Health   Financial Resource Strain: Low Risk    Difficulty of Paying Living Expenses: Not hard at all  Food Insecurity: Not on file  Transportation Needs: Not on file  Physical Activity: Not on file  Stress: Not on file  Social Connections: Not on file     Review of Systems  Constitutional:  Negative for fatigue and unexpected weight change.  HENT:  Negative for congestion and sinus pressure.   Respiratory:  Negative for cough, chest tightness and shortness of breath.   Cardiovascular:  Negative for chest pain, palpitations and leg swelling.  Gastrointestinal:  Negative for abdominal pain, diarrhea, nausea and vomiting.  Genitourinary:  Negative for difficulty urinating and dysuria.  Musculoskeletal:  Negative for joint swelling and myalgias.  Skin:  Negative for color change and rash.  Neurological:  Negative for dizziness, light-headedness and headaches.  Psychiatric/Behavioral:  Negative for agitation and dysphoric mood.       Objective:     BP 112/68    Pulse 90    Temp 97.8 F (  36.6 C)    Resp 16    Ht 5\' 3"  (1.6 m)    Wt 181 lb 9.6 oz (82.4 kg)    SpO2 98%    BMI 32.17 kg/m  Wt Readings from Last 3 Encounters:  08/14/21 181 lb 9.6 oz (82.4 kg)  06/04/21 184 lb 4.8 oz (83.6 kg)  05/14/21 192 lb (87.1 kg)    Physical Exam Vitals reviewed.  Constitutional:      General: She is not in acute distress.    Appearance: Normal appearance.  HENT:     Head: Normocephalic and atraumatic.     Right Ear: External ear normal.     Left Ear: External ear normal.  Eyes:     General: No scleral icterus.       Right eye: No discharge.        Left eye: No discharge.     Conjunctiva/sclera: Conjunctivae normal.  Neck:     Thyroid: No thyromegaly.  Cardiovascular:     Rate and Rhythm: Normal rate and regular rhythm.  Pulmonary:     Effort: No respiratory distress.      Breath sounds: Normal breath sounds. No wheezing.  Abdominal:     General: Bowel sounds are normal.     Palpations: Abdomen is soft.     Tenderness: There is no abdominal tenderness.  Musculoskeletal:        General: No swelling or tenderness.     Cervical back: Neck supple. No tenderness.  Lymphadenopathy:     Cervical: No cervical adenopathy.  Skin:    Findings: No erythema or rash.  Neurological:     Mental Status: She is alert.  Psychiatric:        Mood and Affect: Mood normal.        Behavior: Behavior normal.     Outpatient Encounter Medications as of 08/14/2021  Medication Sig   albuterol (PROVENTIL HFA;VENTOLIN HFA) 108 (90 Base) MCG/ACT inhaler INHALE 2 PUFFS BY MOUTH INTO THE LUNGS EVERY 6 HOURS AS NEEDED FOR WHEEZING OR SHORTNESS OF BREATH   amphetamine-dextroamphetamine (ADDERALL) 20 MG tablet Take 20 mg by mouth as needed. One tablet daily as needed   budesonide-formoterol (SYMBICORT) 80-4.5 MCG/ACT inhaler INHALE 2 PUFFS INTO THE LUNGS TWICE DAILY   cetirizine (ZYRTEC) 10 MG tablet Take 10 mg by mouth daily.   levonorgestrel (MIRENA) 20 MCG/24HR IUD 1 each by Intrauterine route once.   montelukast (SINGULAIR) 10 MG tablet TAKE 1 TABLET BY MOUTH AT BEDTIME   omeprazole (PRILOSEC) 20 MG capsule Take 1 capsule (20 mg total) by mouth daily.   sertraline (ZOLOFT) 100 MG tablet Take 1 tablet (100 mg total) by mouth daily.   tirzepatide [facility]) 5 MG/0.5ML Pen Inject 5 mg into the skin once a week.   No facility-administered encounter medications on file as of 08/14/2021.     Lab Results  Component Value Date   WBC 7.6 02/05/2021   HGB 14.4 02/05/2021   HCT 42.6 02/05/2021   PLT 219.0 02/05/2021   GLUCOSE 91 06/02/2021   CHOL 164 06/02/2021   TRIG 82.0 06/02/2021   HDL 38.70 (L) 06/02/2021   LDLDIRECT 115.0 02/05/2021   LDLCALC 108 (H) 06/02/2021   ALT 28 06/02/2021   AST 20 06/02/2021   NA 140 06/02/2021   K 4.3 06/02/2021   CL 108 06/02/2021    CREATININE 0.76 06/02/2021   BUN 13 06/02/2021   CO2 26 06/02/2021   TSH 1.95 02/05/2021   INR 1.0 04/12/2018  HGBA1C 5.4 11/12/2019    MM DIAG BREAST TOMO UNI LEFT  Result Date: 02/04/2021 CLINICAL DATA:  Callback from screening mammogram for possible asymmetry left breast EXAM: DIGITAL DIAGNOSTIC UNILATERAL LEFT MAMMOGRAM WITH TOMOSYNTHESIS AND CAD TECHNIQUE: Left digital diagnostic mammography and breast tomosynthesis was performed. The images were evaluated with computer-aided detection. COMPARISON:  Prior films ACR Breast Density Category b: There are scattered areas of fibroglandular density. FINDINGS: Lateral view of left breast, spot compression left cc view are submitted. The previously questioned asymmetry does not persist on additional views. No suspicious abnormality is identified. IMPRESSION: Negative. RECOMMENDATION: Routine screening mammogram back schedule. I have discussed the findings and recommendations with the patient. If applicable, a reminder letter will be sent to the patient regarding the next appointment. BI-RADS CATEGORY  1: Negative. Electronically Signed   By: Abelardo Diesel M.D.   On: 02/04/2021 15:31      Assessment & [name]:   Problem List Items Addressed This Visit     ADHD    Has been worked up and diagnosed with ADHD.   Has been on adderall.  Does not take daily.  Follow.       Anemia    hgb 02/05/21 - wnl.       Anxiety    Continue zoloft.  Overall appears to be handling things relatively well.  Follow.       Asthma    Continues on symbicort and singulair.  Breathing stable.  Follow.       BMI 32.0-32.9,adult    On mounjaro and doing well. Tolerating.  Discussed increase to 7.5mg  dose.  She is doing well on 5mg  and continuing to lose weight.  Follow.  Discuss with Catie regarding insurance issues.        Factor 5 Leiden mutation, heterozygous [facility])    Has a documented history of this mutation.  Follow.       GERD (gastroesophageal reflux  disease)    No upper symptoms reported.  On omeprazole.  S/p EGD 12/21/19      History of abnormal cervical Pap smear    Sees gyn ([place]).  Per note review, pap 2021 - wnl.       Hypercholesterolemia    Is walking.  Discussed diet and exercise.  Follow lipid panel.       IUD (intrauterine device) in place    Has IUD in place.   Followed by gyn.  Last evaluated 06/2021.  PAP 2021 - per note wnl.          [name], MD

## 2021-08-14 NOTE — Assessment & Plan Note (Signed)
Is walking.  Discussed diet and exercise.  Follow lipid panel.

## 2021-08-14 NOTE — Assessment & Plan Note (Addendum)
Has IUD in place.   Followed by gyn.  Last evaluated 06/2021.  PAP 2021 - per note wnl.

## 2021-08-15 NOTE — Assessment & Plan Note (Signed)
hgb 02/05/21 - wnl.

## 2021-08-15 NOTE — Assessment & Plan Note (Signed)
Continues on symbicort and singulair.  Breathing stable.  Follow.  

## 2021-08-15 NOTE — Assessment & Plan Note (Signed)
Sees gyn ([facility]).  Per note review, pap 2021 - wnl.  

## 2021-08-15 NOTE — Assessment & Plan Note (Signed)
No upper symptoms reported.  On omeprazole.  S/p EGD 12/21/19 

## 2021-08-15 NOTE — Assessment & Plan Note (Signed)
Has a documented history of this mutation.  Follow.

## 2021-08-15 NOTE — Assessment & Plan Note (Signed)
On mounjaro and doing well. Tolerating.  Discussed increase to 7.5mg  dose.  She is doing well on 5mg  and continuing to lose weight.  Follow.  Discuss with Catie regarding insurance issues.

## 2021-08-15 NOTE — Assessment & Plan Note (Signed)
Has been worked up and diagnosed with ADHD.   Has been on adderall.  Does not take daily.  Follow.  

## 2021-10-01 ENCOUNTER — Telehealth: Payer: No Typology Code available for payment source

## 2021-10-15 ENCOUNTER — Telehealth: Payer: No Typology Code available for payment source

## 2021-10-17 ENCOUNTER — Other Ambulatory Visit: Payer: Self-pay | Admitting: Internal Medicine

## 2021-10-28 ENCOUNTER — Telehealth: Payer: No Typology Code available for payment source | Admitting: Pharmacist

## 2021-10-28 VITALS — Wt 176.0 lb

## 2021-10-28 DIAGNOSIS — Z6831 Body mass index (BMI) 31.0-31.9, adult: Secondary | ICD-10-CM

## 2021-10-28 DIAGNOSIS — E7800 Pure hypercholesterolemia, unspecified: Secondary | ICD-10-CM | POA: Diagnosis not present

## 2021-10-28 MED ORDER — TIRZEPATIDE 7.5 MG/0.5ML ~~LOC~~ SOAJ: 7.5000 mg | SUBCUTANEOUS | 1 refills | Status: DC

## 2021-10-28 NOTE — Patient Instructions (Signed)
[name],  ? ?Increase Mounjaro to 7.5 mg weekly. Keep up the great work with minimizing portion sizes. Work on incorporating physical activity.  ? ?Take care! ? ?[name], PharmD ?

## 2021-10-28 NOTE — Progress Notes (Signed)
? ?   ? ?Chief Complaint  ?Patient presents with  ? Obesity  ? ? ?Kelly [name] is a 46 y.o. year old female who was referred for medication management by their primary care provider, [name], MD. They presented for a virtual visit in the context of the COVID-19 pandemic. ?  ?I connected with Kelly [name] on 10/28/21 at 4:30 pm by video and verified that I am speaking with the correct person using two identifiers. ?  ?I discussed the limitations, risks, security and privacy concerns of performing an evaluation and management service by video and the availability of in person appointments. I also discussed with the patient that there may be a patient responsible charge related to this service. The patient expressed understanding and agreed to proceed. ?  ?Patient location:  work ?My Location:  clinic ?Persons on the video call:  myself and patient  ? ?Subjective: ?Obesity/Overweight, Complicated by Asthma, Anxiety: ? ?Current medications: Mounjaro 5 mg weekly (x about 3 months) - feels this is beneficial for about 2 days, then wears off. Having a harder time controlling cravings lately.  ? ?Weight Management treatments previously prescribed: Previously on phentermine, reported constipation ? ?Current meal patterns: minimizing portion sizes  ?- Breakfast: coffee, sometimes sprite zero ?- Lunch: eats out, but  ?- Supper: ordering [employer] meals; tries to minimize portion sizes ?- Drinks: water; half and half tea - but plans to cut back on  ? ?Current physical activity: active at work, but nothing specific ? ?Current medication access support: reports she is paying out of pocket for [facility], elects to continue to do so. No other medication access options at this time.  ?  ? ?Objective: ? ?Vitals with BMI 10/28/2021 08/14/2021 06/04/2021  ?Height - $RemoveB'5\' 3"'[id number]$  -  ?Weight 176 lbs 181 lbs 10 oz 184 lbs 5 oz  ?BMI - 32.18 32.66  ?Systolic - 235 -  ?Diastolic - 68 -  ?Pulse - 90 -  ? ? ?Lab Results  ?Component Value Date  ?  HGBA1C 5.4 11/12/2019  ? ? ?Lab Results  ?Component Value Date  ? CREATININE 0.76 06/02/2021  ? BUN 13 06/02/2021  ? NA 140 06/02/2021  ? K 4.3 06/02/2021  ? CL 108 06/02/2021  ? CO2 26 06/02/2021  ? ? ?Lab Results  ?Component Value Date  ? CHOL 164 06/02/2021  ? HDL 38.70 (L) 06/02/2021  ? LDLCALC 108 (H) 06/02/2021  ? LDLDIRECT 115.0 02/05/2021  ? TRIG 82.0 06/02/2021  ? CHOLHDL 4 06/02/2021  ? ? ?Medications Reviewed Today   ? ? Reviewed by [name], MD (Physician) on 08/14/21 at 1041  Med List Status: <None>  ? ?Medication Order Taking? Sig Documenting Provider Last Dose Status Informant  ?albuterol (PROVENTIL HFA;VENTOLIN HFA) 108 (90 Base) MCG/ACT inhaler [id number] No INHALE 2 PUFFS BY MOUTH INTO THE LUNGS EVERY 6 HOURS AS NEEDED FOR WHEEZING OR SHORTNESS OF BREATH Shambley, Melody N, CNM Taking Active   ?         ?Med Note ([place] Feb 24, 2021  9:03 AM) Taking ~ 1-2 times weekly  ?amphetamine-dextroamphetamine (ADDERALL) 20 MG tablet [id number] No Take 20 mg by mouth as needed. One tablet daily as needed [provider] Taking Active Self  ?budesonide-formoterol (SYMBICORT) 80-4.5 MCG/ACT inhaler [id number]  INHALE 2 PUFFS INTO THE LUNGS TWICE DAILY [name], MD  Active   ?cetirizine (ZYRTEC) 10 MG tablet [id number] No Take 10 mg by mouth daily. [provider]  Taking Active   ?levonorgestrel (MIRENA) 20 MCG/24HR IUD [id number] No 1 each by Intrauterine route once. [provider] Taking Active   ?montelukast (SINGULAIR) 10 MG tablet [id number] No TAKE 1 TABLET BY MOUTH AT BEDTIME [name], MD Taking Active   ?omeprazole (PRILOSEC) 20 MG capsule [id number]  Take 1 capsule (20 mg total) by mouth daily. [name], MD  Active   ?sertraline (ZOLOFT) 100 MG tablet [id number]  Take 1 tablet (100 mg total) by mouth daily. [name], MD  Active   ?tirzepatide [facility]) 5 MG/0.5ML Pen [id number]  Inject 5 mg into the skin once a week. [name], MD  Active   ? ?  ?  ? ?  ? ? ?Assessment/Plan:  ? ?Obesity/Overweight: ?- Currently unable to achieve goal weight loss of 5-10% through diet and lifestyle modifications alone ?- Extensive dietary counseling including education on focus on lean proteins, fruits and vegetables, whole grains and increased fiber consumption, adequate hydration. Praised for current strategies of portion size reduction ?- Extensive exercise counseling including eventual goal of 150 minutes of moderate intensity exercise weekly ?- Recommend to increase Mounjaro to 7.5 mg weekly per patient request.  ? ?Follow Up Plan: pharmacy needs met. Follow up as needed ? ?[name], PharmD, BCACP, CPP ?Clinical Pharmacist ?Therapist, music at [employer] ?[phone] ? ? ? ?

## 2021-12-19 ENCOUNTER — Other Ambulatory Visit: Payer: Self-pay | Admitting: Internal Medicine

## 2022-02-12 ENCOUNTER — Other Ambulatory Visit: Payer: Self-pay | Admitting: Internal Medicine

## 2022-02-19 ENCOUNTER — Encounter: Payer: Self-pay | Admitting: Internal Medicine

## 2022-02-19 DIAGNOSIS — R7989 Other specified abnormal findings of blood chemistry: Secondary | ICD-10-CM

## 2022-03-19 IMAGING — MG MM DIGITAL DIAGNOSTIC UNILAT*L* W/ TOMO W/ CAD
4 series · 4 of 12 positions shown · non-contrast
Comparison: Prior films

CLINICAL DATA: Callback from screening mammogram for possible
asymmetry left breast

EXAM:
DIGITAL DIAGNOSTIC UNILATERAL LEFT MAMMOGRAM WITH TOMOSYNTHESIS AND
CAD
TECHNIQUE: Left digital diagnostic mammography and breast tomosynthesis was
performed. The images were evaluated with computer-aided detection.

[L ML synth-2D]
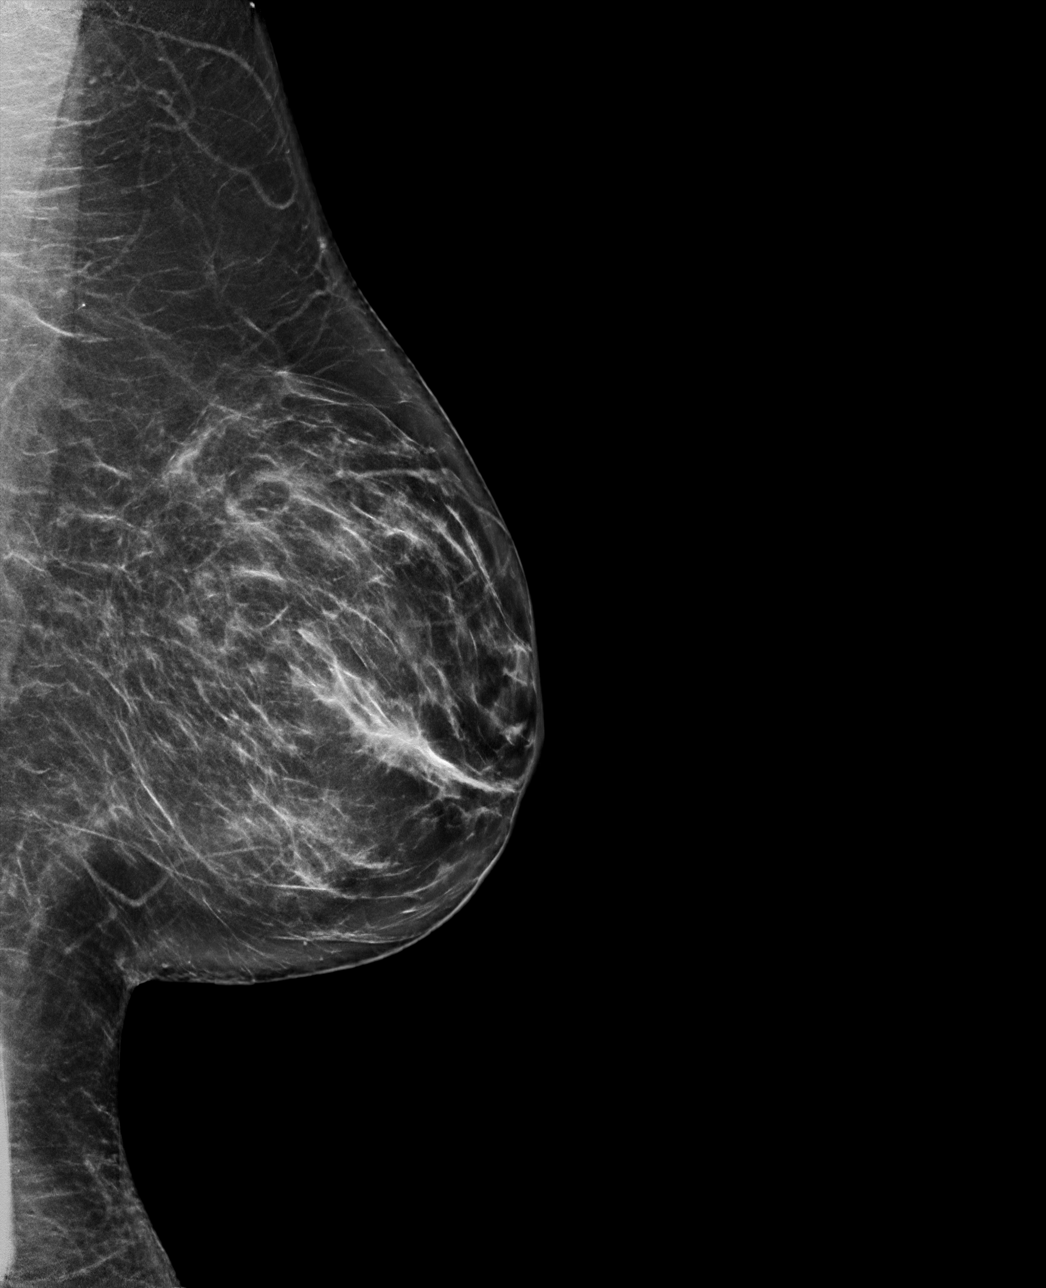

[L CC synth-2D]
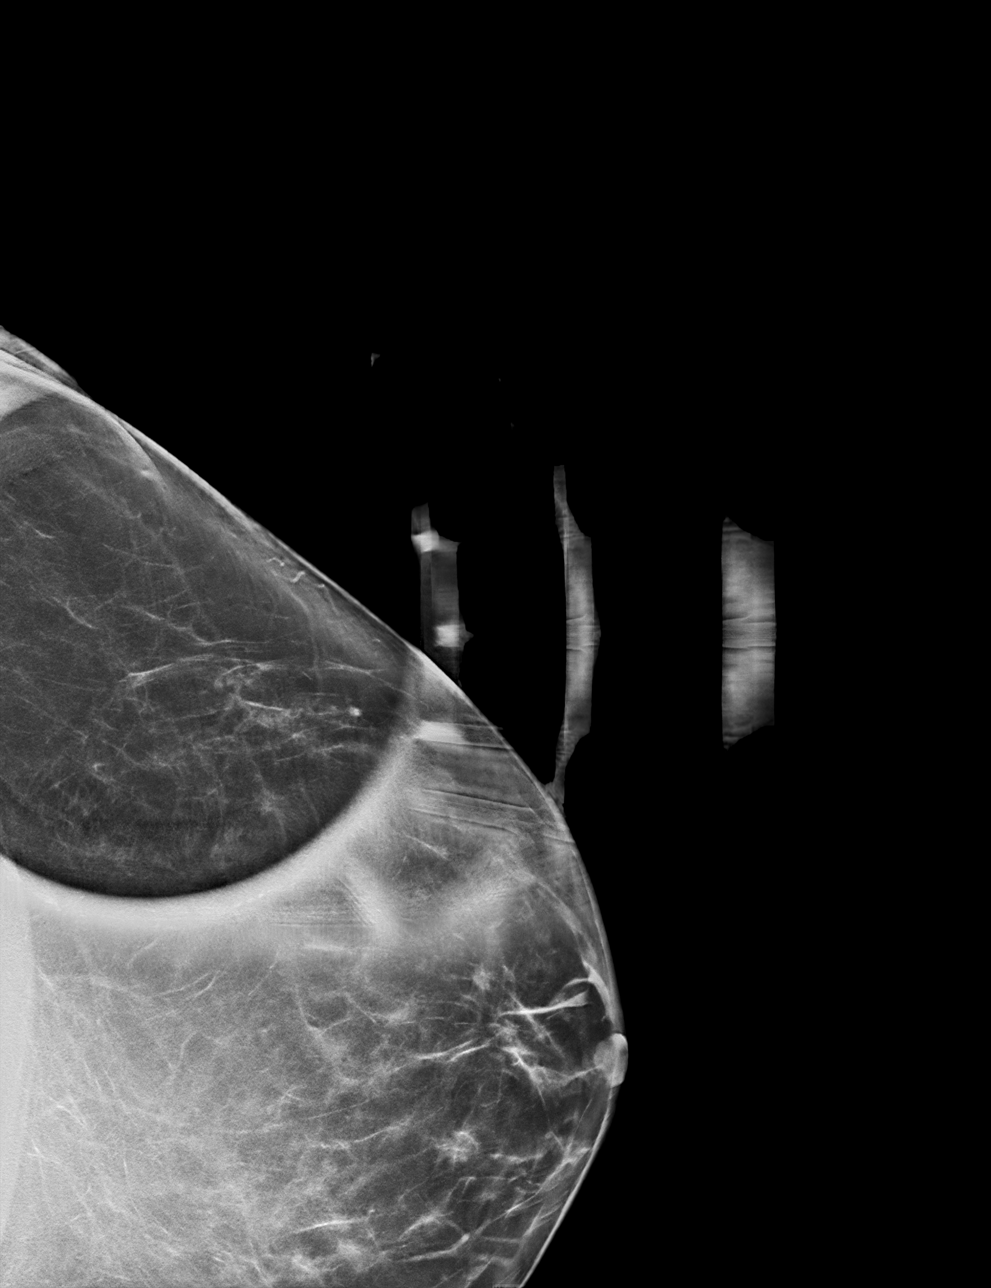

[L CC tomo · tomo slice 41/80.0]
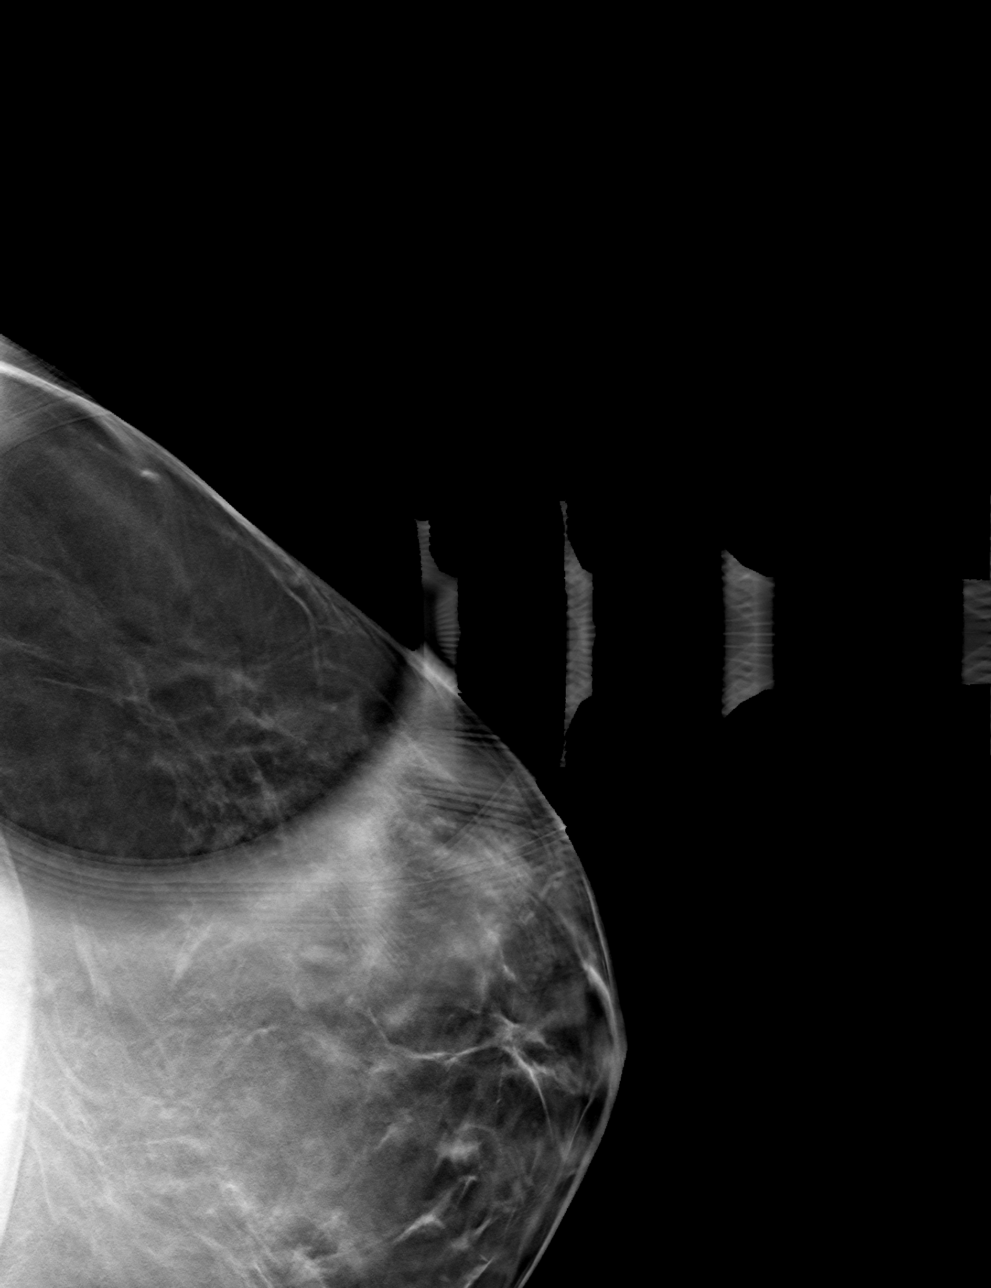

[L ML tomo · tomo slice 41/80.0]
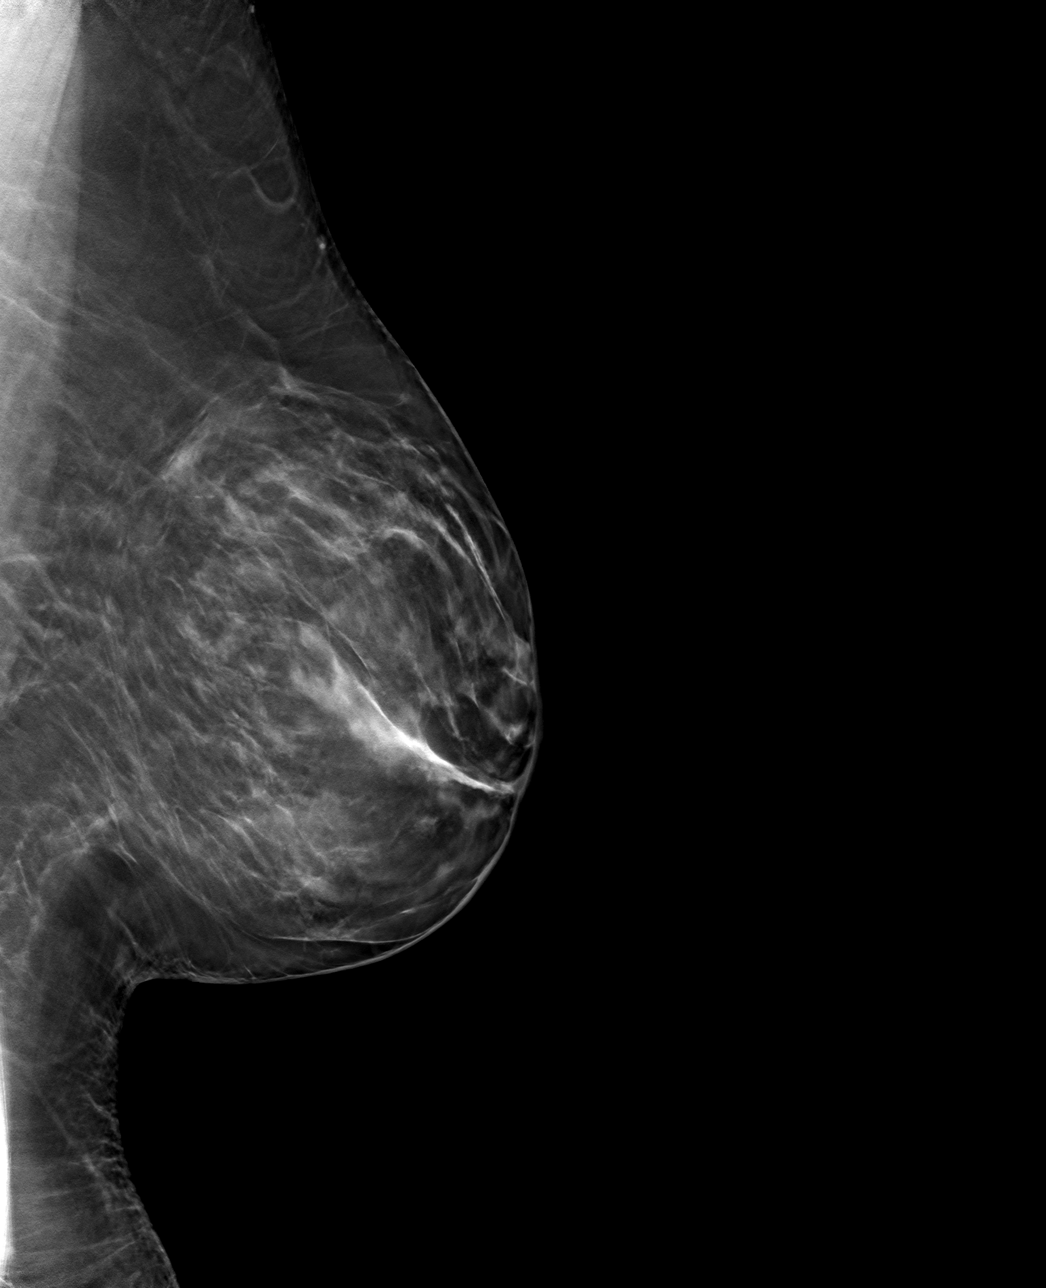

[4 of 12 positions shown; findings below may reference images not displayed]

ACR Breast Density Category b: There are scattered areas of
fibroglandular density.
FINDINGS: Lateral view of left breast, spot compression left cc view are
submitted. The previously questioned asymmetry does not persist on
additional views. No suspicious abnormality is identified.
IMPRESSION: Negative.

RECOMMENDATION:
Routine screening mammogram back schedule.

I have discussed the findings and recommendations with the patient.
If applicable, a reminder letter will be sent to the patient
regarding the next appointment.

BI-RADS CATEGORY  1: Negative.

## 2022-03-22 ENCOUNTER — Other Ambulatory Visit (INDEPENDENT_AMBULATORY_CARE_PROVIDER_SITE_OTHER): Payer: No Typology Code available for payment source

## 2022-03-22 DIAGNOSIS — R7989 Other specified abnormal findings of blood chemistry: Secondary | ICD-10-CM | POA: Diagnosis not present

## 2022-03-22 LAB — HEPATIC FUNCTION PANEL
ALT: 15 U/L (ref 0–35)
AST: 14 U/L (ref 0–37)
Albumin: 4.3 g/dL (ref 3.5–5.2)
Alkaline Phosphatase: 59 U/L (ref 39–117)
Bilirubin, Direct: 0.1 mg/dL (ref 0.0–0.3)
Total Bilirubin: 0.4 mg/dL (ref 0.2–1.2)
Total Protein: 6.4 g/dL (ref 6.0–8.3)

## 2022-04-15 ENCOUNTER — Other Ambulatory Visit: Payer: Self-pay | Admitting: Internal Medicine

## 2022-04-23 ENCOUNTER — Ambulatory Visit: Payer: No Typology Code available for payment source | Admitting: Internal Medicine

## 2022-04-23 ENCOUNTER — Encounter: Payer: Self-pay | Admitting: Internal Medicine

## 2022-04-23 VITALS — BP 124/82 | HR 87 | Temp 97.7°F | Ht 63.0 in | Wt 190.6 lb

## 2022-04-23 DIAGNOSIS — F419 Anxiety disorder, unspecified: Secondary | ICD-10-CM

## 2022-04-23 DIAGNOSIS — D6851 Activated protein C resistance: Secondary | ICD-10-CM

## 2022-04-23 DIAGNOSIS — F909 Attention-deficit hyperactivity disorder, unspecified type: Secondary | ICD-10-CM

## 2022-04-23 DIAGNOSIS — J4540 Moderate persistent asthma, uncomplicated: Secondary | ICD-10-CM

## 2022-04-23 DIAGNOSIS — Z8742 Personal history of other diseases of the female genital tract: Secondary | ICD-10-CM

## 2022-04-23 DIAGNOSIS — K219 Gastro-esophageal reflux disease without esophagitis: Secondary | ICD-10-CM

## 2022-04-23 DIAGNOSIS — E559 Vitamin D deficiency, unspecified: Secondary | ICD-10-CM | POA: Diagnosis not present

## 2022-04-23 DIAGNOSIS — Z713 Dietary counseling and surveillance: Secondary | ICD-10-CM

## 2022-04-23 DIAGNOSIS — E7800 Pure hypercholesterolemia, unspecified: Secondary | ICD-10-CM

## 2022-04-23 DIAGNOSIS — R232 Flushing: Secondary | ICD-10-CM

## 2022-04-23 LAB — CBC WITH DIFFERENTIAL/PLATELET
Basophils Absolute: 0.1 10*3/uL (ref 0.0–0.1)
Basophils Relative: 0.9 % (ref 0.0–3.0)
Eosinophils Absolute: 0.2 10*3/uL (ref 0.0–0.7)
Eosinophils Relative: 3.1 % (ref 0.0–5.0)
HCT: 43.9 % (ref 36.0–46.0)
Hemoglobin: 14.5 g/dL (ref 12.0–15.0)
Lymphocytes Relative: 26.4 % (ref 12.0–46.0)
Lymphs Abs: 1.7 10*3/uL (ref 0.7–4.0)
MCHC: 33 g/dL (ref 30.0–36.0)
MCV: 88.2 fl (ref 78.0–100.0)
Monocytes Absolute: 0.4 10*3/uL (ref 0.1–1.0)
Monocytes Relative: 6.1 % (ref 3.0–12.0)
Neutro Abs: 4.2 10*3/uL (ref 1.4–7.7)
Neutrophils Relative %: 63.5 % (ref 43.0–77.0)
Platelets: 208 10*3/uL (ref 150.0–400.0)
RBC: 4.98 Mil/uL (ref 3.87–5.11)
RDW: 13 % (ref 11.5–15.5)
WBC: 6.6 10*3/uL (ref 4.0–10.5)

## 2022-04-23 LAB — VITAMIN D 25 HYDROXY (VIT D DEFICIENCY, FRACTURES): VITD: 38.51 ng/mL (ref 30.00–100.00)

## 2022-04-23 LAB — LIPID PANEL
Cholesterol: 199 mg/dL (ref 0–200)
HDL: 46.6 mg/dL (ref >39.00)
LDL Cholesterol: 129 mg/dL — ABNORMAL HIGH (ref 0–99)
NonHDL: 152.81
Total CHOL/HDL Ratio: 4
Triglycerides: 121 mg/dL (ref 0.0–149.0)
VLDL: 24.2 mg/dL (ref 0.0–40.0)

## 2022-04-23 LAB — COMPREHENSIVE METABOLIC PANEL
ALT: 23 U/L (ref 0–35)
AST: 16 U/L (ref 0–37)
Albumin: 4.2 g/dL (ref 3.5–5.2)
Alkaline Phosphatase: 65 U/L (ref 39–117)
BUN: 12 mg/dL (ref 6–23)
CO2: 23 mEq/L (ref 19–32)
Calcium: 9.7 mg/dL (ref 8.4–10.5)
Chloride: 107 mEq/L (ref 96–112)
Creatinine, Ser: 0.76 mg/dL (ref 0.40–1.20)
GFR: 94.44 mL/min (ref >60.00)
Glucose, Bld: 110 mg/dL — ABNORMAL HIGH (ref 70–99)
Potassium: 4.3 mEq/L (ref 3.5–5.1)
Sodium: 138 mEq/L (ref 135–145)
Total Bilirubin: 0.5 mg/dL (ref 0.2–1.2)
Total Protein: 6.4 g/dL (ref 6.0–8.3)

## 2022-04-23 LAB — TSH: TSH: 2.04 u[IU]/mL (ref 0.35–5.50)

## 2022-04-23 NOTE — Progress Notes (Signed)
Patient ID: Kelly [name], female   DOB: February 02, 1976, 46 y.o.   MRN: [id number]   Subjective:    Patient ID: Kelly [name], female    DOB: February 21, 1976, 46 y.o.   MRN: [id number]   Patient here for a scheduled follow up.   Chief Complaint  Patient presents with   Follow-up   .   HPI Here to follow up regarding her cholesterol, weight loss and increased stress.  Reports problems with hot flashes.  Seeing a physician in [place].  Started on testosterone cream.  Has been on for three months.  Feels possibly helping some.  Night sweats better.   Had mirena IUD.  No spotting now.  Discussed wegovy.  Pt was on ozempic.  Insurance no longer covering.  Discussed wegovy.  Will check pt assistance.  No chest pain or sob reported.  No abdominal pain or bowel change reported.     Past Medical History:  Diagnosis Date   ADHD    Allergy    Anxiety    Asthma    Fatigue    GERD (gastroesophageal reflux disease)    Past Surgical History:  Procedure Laterality Date   LAMINECTOMY     Family History  Problem Relation Age of Onset   Hypertension Mother    Non-Hodgkin's lymphoma Father    Lymphoma Father    Cancer Father    Cancer - Cervical Father    Breast cancer Maternal Aunt    Colon polyps Neg Hx    Colon cancer Neg Hx    Rectal cancer Neg Hx    Stomach cancer Neg Hx    Social History   Socioeconomic History   Marital status: Married    Spouse name: Not on file   Number of children: Not on file   Years of education: Not on file   Highest education level: Not on file  Occupational History   Not on file  Tobacco Use   Smoking status: Never   Smokeless tobacco: Never  Vaping Use   Vaping Use: Never used  Substance and Sexual Activity   Alcohol use: No   Drug use: No   Sexual activity: Yes    Birth control/protection: I.U.D.    Comment: mierna  Other Topics Concern   Not on file  Social History Narrative   Not on file   Social Determinants of Health   Financial  Resource Strain: Low Risk  (08/04/2021)   Overall Financial Resource Strain (CARDIA)    Difficulty of Paying Living Expenses: Not hard at all  Food Insecurity: Not on file  Transportation Needs: Not on file  Physical Activity: Not on file  Stress: Not on file  Social Connections: Not on file     Review of Systems  Constitutional:  Negative for appetite change and unexpected weight change.  HENT:  Negative for congestion and sinus pressure.   Respiratory:  Negative for cough, chest tightness and shortness of breath.   Cardiovascular:  Negative for chest pain, palpitations and leg swelling.  Gastrointestinal:  Negative for abdominal pain, diarrhea, nausea and vomiting.  Genitourinary:  Negative for difficulty urinating and dysuria.  Musculoskeletal:  Negative for joint swelling and myalgias.  Skin:  Negative for color change and rash.  Neurological:  Negative for dizziness, light-headedness and headaches.  Psychiatric/Behavioral:  Negative for agitation and dysphoric mood.        Objective:     BP 124/82 (BP Location: Left Arm, Patient Position: Sitting, Cuff Size: Large)  Pulse 87   Temp 97.7 F (36.5 C) (Oral)   Ht '5\' 3"'$  (1.6 m)   Wt 190 lb 9.6 oz (86.5 kg)   LMP  (LMP Unknown)   SpO2 97%   BMI 33.76 kg/m  Wt Readings from Last 3 Encounters:  04/23/22 190 lb 9.6 oz (86.5 kg)  10/28/21 176 lb (79.8 kg)  08/14/21 181 lb 9.6 oz (82.4 kg)    Physical Exam Vitals reviewed.  Constitutional:      General: She is not in acute distress.    Appearance: Normal appearance.  HENT:     Head: Normocephalic and atraumatic.     Right Ear: External ear normal.     Left Ear: External ear normal.  Eyes:     General: No scleral icterus.       Right eye: No discharge.        Left eye: No discharge.     Conjunctiva/sclera: Conjunctivae normal.  Neck:     Thyroid: No thyromegaly.  Cardiovascular:     Rate and Rhythm: Normal rate and regular rhythm.  Pulmonary:     Effort: No  respiratory distress.     Breath sounds: Normal breath sounds. No wheezing.  Abdominal:     General: Bowel sounds are normal.     Palpations: Abdomen is soft.     Tenderness: There is no abdominal tenderness.  Musculoskeletal:        General: No swelling or tenderness.     Cervical back: Neck supple. No tenderness.  Lymphadenopathy:     Cervical: No cervical adenopathy.  Skin:    Findings: No erythema or rash.  Neurological:     Mental Status: She is alert.  Psychiatric:        Mood and Affect: Mood normal.        Behavior: Behavior normal.      Outpatient Encounter Medications as of 04/23/2022  Medication Sig   albuterol (PROVENTIL HFA;VENTOLIN HFA) 108 (90 Base) MCG/ACT inhaler INHALE 2 PUFFS BY MOUTH INTO THE LUNGS EVERY 6 HOURS AS NEEDED FOR WHEEZING OR SHORTNESS OF BREATH   amphetamine-dextroamphetamine (ADDERALL) 20 MG tablet Take 20 mg by mouth as needed. One tablet daily as needed   budesonide-formoterol (SYMBICORT) 80-4.5 MCG/ACT inhaler INHALE 2 PUFFS INTO THE LUNGS TWICE DAILY   cetirizine (ZYRTEC) 10 MG tablet Take 10 mg by mouth daily.   levonorgestrel (MIRENA) 20 MCG/24HR IUD 1 each by Intrauterine route once.   montelukast (SINGULAIR) 10 MG tablet TAKE 1 TABLET BY MOUTH AT BEDTIME   omeprazole (PRILOSEC) 20 MG capsule Take 1 capsule (20 mg total) by mouth daily.   sertraline (ZOLOFT) 100 MG tablet TAKE 1 TABLET BY MOUTH ONCE A DAY   Testosterone POWD Apply 2 clicks or 0.5 mL daily to inner thighs   [DISCONTINUED] tirzepatide (MOUNJARO) 7.5 MG/0.5ML Pen Inject 7.5 mg into the skin once a week. (Patient not taking: Reported on 04/23/2022)   No facility-administered encounter medications on file as of 04/23/2022.     Lab Results  Component Value Date   WBC 6.6 04/23/2022   HGB 14.5 04/23/2022   HCT 43.9 04/23/2022   PLT 208.0 04/23/2022   GLUCOSE 110 (H) 04/23/2022   CHOL 199 04/23/2022   TRIG 121.0 04/23/2022   HDL 46.60 04/23/2022   LDLDIRECT 115.0  02/05/2021   LDLCALC 129 (H) 04/23/2022   ALT 23 04/23/2022   AST 16 04/23/2022   NA 138 04/23/2022   K 4.3 04/23/2022   CL 107  04/23/2022   CREATININE 0.76 04/23/2022   BUN 12 04/23/2022   CO2 23 04/23/2022   TSH 2.04 04/23/2022   INR 1.0 04/12/2018   HGBA1C 5.4 11/12/2019    MM DIAG BREAST TOMO UNI LEFT  Result Date: 02/04/2021 CLINICAL DATA:  Callback from screening mammogram for possible asymmetry left breast EXAM: DIGITAL DIAGNOSTIC UNILATERAL LEFT MAMMOGRAM WITH TOMOSYNTHESIS AND CAD TECHNIQUE: Left digital diagnostic mammography and breast tomosynthesis was performed. The images were evaluated with computer-aided detection. COMPARISON:  Prior films ACR Breast Density Category b: There are scattered areas of fibroglandular density. FINDINGS: Lateral view of left breast, spot compression left cc view are submitted. The previously questioned asymmetry does not persist on additional views. No suspicious abnormality is identified. IMPRESSION: Negative. RECOMMENDATION: Routine screening mammogram back schedule. I have discussed the findings and recommendations with the patient. If applicable, a reminder letter will be sent to the patient regarding the next appointment. BI-RADS CATEGORY  1: Negative. Electronically Signed   By: Abelardo Diesel M.D.   On: 02/04/2021 15:31      Assessment & [name]:   Problem List Items Addressed This Visit     ADHD    Has been worked up and diagnosed with ADHD.   Has been on adderall.  Does not take daily.  Follow.       Anxiety    Continue zoloft.  Overall appears to be handling things relatively well.  Follow.       Asthma    Continues on symbicort and singulair.  Breathing stable.  Follow.       Factor 5 Leiden mutation, heterozygous [facility])    Has a documented history of this mutation.  Discuss with pt/hematology - question of any need for further evaluation.        GERD (gastroesophageal reflux disease)    No upper symptoms reported.  On  omeprazole.  S/p EGD 12/21/19      History of abnormal cervical Pap smear    Sees gyn ([place]).  Per note review, pap 2021 - wnl.       Hot flashes    Seeing a provider in [place] - prescribing testosterone.  Discussed possible side effects/risks of testosterone therapy.      Hypercholesterolemia - Primary    Low cholesterol diet and exercise.  Follow lipid panel.       Relevant Orders   CBC with Differential/Platelet (Completed)   Comprehensive metabolic panel (Completed)   Lipid panel (Completed)   TSH (Completed)   Vitamin D deficiency    Check vitamin D level.        Relevant Orders   VITAMIN D 25 Hydroxy (Vit-D Deficiency, Fractures) (Completed)   Weight loss counseling, encounter for    Previously on ozempic.  Insurance does not cover.  Did well on the medication.  Discussed wegovy (trial).  See about coverage.  Follow.         [name], MD

## 2022-04-29 ENCOUNTER — Telehealth: Payer: Self-pay

## 2022-04-29 MED ORDER — WEGOVY 0.25 MG/0.5ML ~~LOC~~ SOAJ: 0.2500 mg | SUBCUTANEOUS | 0 refills | Status: DC

## 2022-04-29 NOTE — Telephone Encounter (Signed)
Pt returned call and stated that she was able to get the savings card fr the [facility] and needs the rx sent to [facility].  Rx has been sent to pharmacy.

## 2022-04-29 NOTE — Telephone Encounter (Signed)
LMTCB. Please transfer call to [place], CMA.

## 2022-04-29 NOTE — Telephone Encounter (Signed)
-----   Message from [name], MD sent at 04/29/2022  4:21 AM EDT ----- Janett Billow, can you help with this.  She was previously on ozempic.  (Not diabetic).  Her insurance does not cover medication.  Wants to start wegovy.  Is there a referral or pt assistance that would help?  Thank you for your help.

## 2022-05-03 ENCOUNTER — Encounter: Payer: Self-pay | Admitting: Internal Medicine

## 2022-05-03 DIAGNOSIS — R232 Flushing: Secondary | ICD-10-CM | POA: Insufficient documentation

## 2022-05-03 DIAGNOSIS — Z713 Dietary counseling and surveillance: Secondary | ICD-10-CM | POA: Insufficient documentation

## 2022-05-03 NOTE — Assessment & Plan Note (Signed)
Has been worked up and diagnosed with ADHD.   Has been on adderall.  Does not take daily.  Follow.

## 2022-05-03 NOTE — Assessment & Plan Note (Signed)
Continues on symbicort and singulair.  Breathing stable.  Follow.  

## 2022-05-03 NOTE — Assessment & Plan Note (Signed)
Low cholesterol diet and exercise.  Follow lipid panel.   

## 2022-05-03 NOTE — Assessment & Plan Note (Signed)
Sees gyn ([facility]).  Per note review, pap 2021 - wnl.  

## 2022-05-03 NOTE — Assessment & Plan Note (Signed)
Has a documented history of this mutation.  Discuss with pt/hematology - question of any need for further evaluation.   

## 2022-05-03 NOTE — Assessment & Plan Note (Signed)
Seeing a provider in [place] - prescribing testosterone.  Discussed possible side effects/risks of testosterone therapy.

## 2022-05-03 NOTE — Assessment & Plan Note (Signed)
Continue zoloft.  Overall appears to be handling things relatively well.  Follow.  

## 2022-05-03 NOTE — Assessment & Plan Note (Signed)
Previously on ozempic.  Insurance does not cover.  Did well on the medication.  Discussed wegovy (trial).  See about coverage.  Follow.

## 2022-05-03 NOTE — Assessment & Plan Note (Signed)
No upper symptoms reported.  On omeprazole.  S/p EGD 12/21/19 

## 2022-05-03 NOTE — Assessment & Plan Note (Signed)
Check vitamin D level 

## 2022-05-13 ENCOUNTER — Telehealth: Payer: Self-pay | Admitting: Internal Medicine

## 2022-05-13 DIAGNOSIS — R739 Hyperglycemia, unspecified: Secondary | ICD-10-CM

## 2022-05-13 NOTE — Telephone Encounter (Signed)
Patient has a lab appt 05/18/22, there are no orders in.

## 2022-05-14 NOTE — Addendum Note (Signed)
Addended by: Alisa Graff on: 05/14/2022 04:23 AM   Modules accepted: Orders

## 2022-05-14 NOTE — Telephone Encounter (Signed)
Orders placed for fasting lab

## 2022-05-18 ENCOUNTER — Other Ambulatory Visit (INDEPENDENT_AMBULATORY_CARE_PROVIDER_SITE_OTHER): Payer: No Typology Code available for payment source

## 2022-05-18 DIAGNOSIS — R739 Hyperglycemia, unspecified: Secondary | ICD-10-CM

## 2022-05-18 LAB — HEMOGLOBIN A1C: Hgb A1c MFr Bld: 5.6 % (ref 4.6–6.5)

## 2022-05-19 ENCOUNTER — Telehealth: Payer: Self-pay | Admitting: Internal Medicine

## 2022-05-19 NOTE — Telephone Encounter (Signed)
My chart message sent to pt with question of family history.

## 2022-05-21 ENCOUNTER — Other Ambulatory Visit: Payer: Self-pay | Admitting: Internal Medicine

## 2022-05-24 LAB — EXTRA SPECIMEN

## 2022-05-24 LAB — GLUCOSE, FASTING

## 2022-06-01 ENCOUNTER — Encounter: Payer: Self-pay | Admitting: Internal Medicine

## 2022-06-01 ENCOUNTER — Telehealth (INDEPENDENT_AMBULATORY_CARE_PROVIDER_SITE_OTHER): Payer: No Typology Code available for payment source | Admitting: Internal Medicine

## 2022-06-01 VITALS — Temp 97.6°F | Ht 63.0 in | Wt 190.0 lb

## 2022-06-01 DIAGNOSIS — R0982 Postnasal drip: Secondary | ICD-10-CM

## 2022-06-01 DIAGNOSIS — R1013 Epigastric pain: Secondary | ICD-10-CM | POA: Diagnosis not present

## 2022-06-01 DIAGNOSIS — U071 COVID-19: Secondary | ICD-10-CM | POA: Diagnosis not present

## 2022-06-01 MED ORDER — SALINE SPRAY 0.65 % NA SOLN: 2.0000 | NASAL | 2 refills | Status: DC | PRN

## 2022-06-01 MED ORDER — MOLNUPIRAVIR EUA 200MG CAPSULE: 4.0000 | ORAL_CAPSULE | Freq: Two times a day (BID) | ORAL | 0 refills | Status: AC

## 2022-06-01 MED ORDER — MOLNUPIRAVIR EUA 200MG CAPSULE: 4.0000 | ORAL_CAPSULE | Freq: Two times a day (BID) | ORAL | 0 refills | Status: DC

## 2022-06-01 NOTE — Patient Instructions (Signed)
If needing prescription strength medication we will need to make an appointment with a provider.  These are over the counter medication options:  Mucinex dm green label for cough or robitussin DM  Multivitamin or below vitamins  Vitamin C 1000 mg daily.  Vitamin D3 4000 Iu (units) daily.  Zinc 100 mg daily.  Quercetin 250-500 mg 2 times per day   Elderberry  Oil of oregano  cepacol or chloroseptic spray Warm salt water gargles +hydrogen peroxide Sugar free cough drops  Warm tea with honey and lemon  Hydration  Try to eat though you dont feel like it   Tylenol or Advil  Nasal saline and Flonase 2 sprays nasal congestion  If sneezing/runny nose over the counter allergy pill claritin,allegra, zyrtec, xyzal Quarantine x 10-14 days 14 days preferred   Monitor pulse oximeter, buy from [facility] if oxygen is less than 90 please go to the hospital.        Are you feeling really sick? Shortness of breath, cough, chest pain?, dizziness? Confusion   If so let me know  If worsening, go to hospital or [facility] clinic Urgent care for further treatment.     Molnupiravir Capsules What is this medication? MOLNUPIRAVIR (MOL nue PIR a vir) treats mild to moderate COVID-19. It may help people who are at high risk of developing severe illness. This medication works by limiting the spread of the virus in your body. The FDA has allowed the emergency use of this medication. This medicine may be used for other purposes; ask your health care provider or pharmacist if you have questions. COMMON BRAND NAME(S): LAGEVRIO What should I tell my care team before I take this medication? They need to know if you have any of these conditions: Any allergies Any serious illness An unusual or allergic reaction to molnupiravir, other medications, foods, dyes, or preservatives Pregnant or trying to get pregnant Breast-feeding How should I use this medication? Take this medication by mouth with water. Take it as  directed on the prescription label at the same time every day. Do not cut, crush, or chew this medication. Swallow the capsules whole. You can take it with or without food. If it upsets your stomach, take it with food. Take all of it unless your care team tells you to stop it early. Keep taking it even if you think you are better. Talk to your care team about the use of this medication in children. Special care may be needed. Overdosage: If you think you have taken too much of this medicine contact a poison control center or emergency room at once. NOTE: This medicine is only for you. Do not share this medicine with others. What if I miss a dose? If you miss a dose, take it as soon as you can unless it is more than 10 hours late. If it is more than 10 hours late, skip the missed dose. Take the next dose at the normal time. Do not take extra or 2 doses at the same time to make up for the missed dose. What may interact with this medication? Interactions have not been studied. This list may not describe all possible interactions. Give your health care provider a list of all the medicines, herbs, non-prescription drugs, or dietary supplements you use. Also tell them if you smoke, drink alcohol, or use illegal drugs. Some items may interact with your medicine. What should I watch for while using this medication? Your condition will be monitored carefully while you are  receiving this medication. Visit your care team for regular checkups. Tell your care team if your symptoms do not start to get better or if they get worse. Do not become pregnant while taking this medication. You may need a pregnancy test before starting this medication. Women must use a reliable form of birth control while taking this medication and for 4 days after stopping the medication. Women should inform their care team if they wish to become pregnant or think they might be pregnant. Men should not father a child while taking this  medication and for 3 months after stopping it. There is potential for serious harm to an unborn child. Talk to your care team for more information. Do not breast-feed an infant while taking this medication and for 4 days after stopping the medication. What side effects may I notice from receiving this medication? Side effects that you should report to your care team as soon as possible: Allergic reactions--skin rash, itching, hives, swelling of the face, lips, tongue, or throat Side effects that usually do not require medical attention (report these to your care team if they continue or are bothersome): Diarrhea Dizziness Nausea This list may not describe all possible side effects. Call your doctor for medical advice about side effects. You may report side effects to FDA at 1-800-FDA-1088. Where should I keep my medication? Keep out of the reach of children and pets. Store at room temperature between 20 and 25 degrees C (68 and 77 degrees F). Get rid of any unused medication after the expiration date. To get rid of medications that are no longer needed or have expired: Take the medication to a medication take-back program. Check with your pharmacy or law enforcement to find a location. If you cannot return the medication, check the label or package insert to see if the medication should be thrown out in the garbage or flushed down the toilet. If you are not sure, ask your care team. If it is safe to put it in the trash, take the medication out of the container. Mix the medication with cat litter, dirt, coffee grounds, or other unwanted substance. Seal the mixture in a bag or container. Put it in the trash. NOTE: This sheet is a summary. It may not cover all possible information. If you have questions about this medicine, talk to your doctor, pharmacist, or health care provider.  2023 Elsevier/Gold Standard (2020-08-25 00:00:00)

## 2022-06-01 NOTE — Progress Notes (Signed)
Telephone Note  I connected with Kelly [name]  on 06/01/22 at  4 PM EDT by telephone and verified that I am speaking with the correct person using two identifiers.  Location patient: [place] Location provider:work or home office Persons participating in the virtual visit: patient, provider  I discussed the limitations and requested verbal permission for telemedicine visit. The patient expressed understanding and agreed to proceed.   HPI:  Acute telemedicine visit for : She traveled to [place] Thursday and had lower back pain she thought was due to steaming carpets, she tried advil Friday and back still hurting felt better Saturday.  She is now covid 19 + and 3rd time with covid and sat/Sunday she had burning in upper abdomen, +PND (see meds tried below) egd in 2021 normal she states sprite zero a swig of this burned her stomach so she tried prilosec 20 mg qd and peptobismol. She also feels like she has a head cold and clammy she was weak but weakness improved and she has tried a bland diet. She has also tried zyrtec and sudafed and vitamin D3 2000 IU qd  Of note husband also having sx's and prior was hospitalized for covid at the covid 19 center   H/o factor V Leiden   -Pertinent past medical history: see below -Pertinent medication allergies:No Known Allergies -COVID-19 vaccine status:  Immunization History  Administered Date(s) Administered   Influenza,inj,Quad PF,6+ Mos 06/25/2020   Influenza-Unspecified 05/31/2016, 05/30/2021   PFIZER(Purple Top)SARS-COV-2 Vaccination 04/24/2020, 05/15/2020     ROS: See pertinent positives and negatives per HPI.  Past Medical History:  Diagnosis Date   ADHD    Allergy    Anxiety    Asthma    COVID-19    x 3 as of 06/01/22   Fatigue    GERD (gastroesophageal reflux disease)     Past Surgical History:  Procedure Laterality Date   LAMINECTOMY       Current Outpatient Medications:    albuterol (PROVENTIL HFA;VENTOLIN HFA) 108 (90  Base) MCG/ACT inhaler, INHALE 2 PUFFS BY MOUTH INTO THE LUNGS EVERY 6 HOURS AS NEEDED FOR WHEEZING OR SHORTNESS OF BREATH, Disp: 18 g, Rfl: 11   amphetamine-dextroamphetamine (ADDERALL) 20 MG tablet, Take 20 mg by mouth as needed. One tablet daily as needed, Disp: , Rfl:    budesonide-formoterol (SYMBICORT) 80-4.5 MCG/ACT inhaler, INHALE 2 PUFFS INTO THE LUNGS TWICE DAILY, Disp: 10.2 g, Rfl: 1   cetirizine (ZYRTEC) 10 MG tablet, Take 10 mg by mouth daily., Disp: , Rfl:    levonorgestrel (MIRENA) 20 MCG/24HR IUD, 1 each by Intrauterine route once., Disp: , Rfl:    montelukast (SINGULAIR) 10 MG tablet, TAKE 1 TABLET BY MOUTH AT BEDTIME, Disp: 30 tablet, Rfl: 12   omeprazole (PRILOSEC) 20 MG capsule, TAKE 1 CAPSULE BY MOUTH ONCE DAILY, Disp: 90 capsule, Rfl: 3   sertraline (ZOLOFT) 100 MG tablet, TAKE 1 TABLET BY MOUTH ONCE A DAY, Disp: 90 tablet, Rfl: 1   Testosterone POWD, Apply 2 clicks or 0.5 mL daily to inner thighs, Disp: , Rfl:    molnupiravir EUA (LAGEVRIO) 200 mg CAPS capsule, Take 4 capsules (800 mg total) by mouth 2 (two) times daily for 5 days. With food, Disp: 40 capsule, Rfl: 0   Semaglutide-Weight Management (WEGOVY) 0.25 MG/0.5ML SOAJ, Inject 0.25 mg into the skin once a week. (Patient not taking: Reported on 06/01/2022), Disp: 2 mL, Rfl: 0   sodium chloride (OCEAN) 0.65 % SOLN nasal spray, Place 2 sprays into both  nostrils as needed for congestion., Disp: 30 mL, Rfl: 2  EXAM:  VITALS per patient if applicable:  GENERAL: alert, oriented, appears well and in no acute distress   MS: moves all visible extremities without noticeable abnormality  PSYCH/NEURO: pleasant and cooperative, no obvious depression or anxiety, speech and thought processing grossly intact  ASSESSMENT AND PLAN:  Discussed the following assessment and plan:  COVID-19 - Plan: molnupiravir EUA (LAGEVRIO) 200 mg CAPS capsule, DISCONTINUED: molnupiravir EUA (LAGEVRIO) 400 mg CAPS capsulebid x 5 days   PND  (post-nasal drip) - Plan: sodium chloride (OCEAN) 0.65 % SOLN nasal spray, DISCONTINUED: sodium chloride (OCEAN) 0.65 % SOLN nasal spray On zyrtec and prn sudafed   Dyspepsia Peptobismol, pepcid and prilosec   -we discussed possible serious and likely etiologies, options for evaluation and workup, limitations of telemedicine visit vs in person visit, treatment, treatment risks and precautions. Pt is agreeable to treatment via telemedicine at this moment.   I discussed the assessment and treatment plan with the patient. The patient was provided an opportunity to ask questions and all were answered. The patient agreed with the plan and demonstrated an understanding of the instructions.    Time spent 20 min Delorise Jackson, MD

## 2022-07-13 ENCOUNTER — Other Ambulatory Visit: Payer: Self-pay | Admitting: Internal Medicine

## 2022-07-27 ENCOUNTER — Ambulatory Visit: Payer: No Typology Code available for payment source | Admitting: Internal Medicine

## 2022-08-31 ENCOUNTER — Telehealth: Payer: Self-pay | Admitting: Internal Medicine

## 2022-08-31 ENCOUNTER — Other Ambulatory Visit: Payer: Self-pay | Admitting: *Deleted

## 2022-08-31 MED ORDER — BUDESONIDE-FORMOTEROL FUMARATE 80-4.5 MCG/ACT IN AERO: 2.0000 | INHALATION_SPRAY | Freq: Two times a day (BID) | RESPIRATORY_TRACT | 1 refills | Status: DC

## 2022-08-31 NOTE — Telephone Encounter (Signed)
Prescription Request  08/31/2022  Is this a "Controlled Substance" medicine? No  LOV: 04/23/2022  What is the name of the medication or equipment? budesonide-formoterol (SYMBICORT) 80-4.5 MCG/ACT inhaler   Have you contacted your pharmacy to request a refill? No   Which pharmacy would you like this sent to?   [id number] - [place], [place] - [address] [place] [address] Phone: [phone] Fax: [phone]    Patient notified that their request is being sent to the clinical staff for review and that they should receive a response within 2 business days.   Please advise at Mobile [phone] (mobile)

## 2022-08-31 NOTE — Telephone Encounter (Signed)
Rx sent to [facility] pharmacy.  

## 2022-09-29 ENCOUNTER — Ambulatory Visit: Payer: 59 | Admitting: Internal Medicine

## 2022-09-29 ENCOUNTER — Encounter: Payer: Self-pay | Admitting: Internal Medicine

## 2022-09-29 VITALS — BP 120/76 | HR 89 | Temp 98.0°F | Resp 16 | Ht 63.0 in | Wt 191.0 lb

## 2022-09-29 DIAGNOSIS — E7800 Pure hypercholesterolemia, unspecified: Secondary | ICD-10-CM

## 2022-09-29 DIAGNOSIS — K219 Gastro-esophageal reflux disease without esophagitis: Secondary | ICD-10-CM | POA: Diagnosis not present

## 2022-09-29 DIAGNOSIS — Z713 Dietary counseling and surveillance: Secondary | ICD-10-CM

## 2022-09-29 DIAGNOSIS — F419 Anxiety disorder, unspecified: Secondary | ICD-10-CM | POA: Diagnosis not present

## 2022-09-29 DIAGNOSIS — D649 Anemia, unspecified: Secondary | ICD-10-CM | POA: Diagnosis not present

## 2022-09-29 DIAGNOSIS — J4540 Moderate persistent asthma, uncomplicated: Secondary | ICD-10-CM | POA: Diagnosis not present

## 2022-09-29 DIAGNOSIS — Z8742 Personal history of other diseases of the female genital tract: Secondary | ICD-10-CM | POA: Diagnosis not present

## 2022-09-29 DIAGNOSIS — D6851 Activated protein C resistance: Secondary | ICD-10-CM | POA: Diagnosis not present

## 2022-09-29 DIAGNOSIS — Z23 Encounter for immunization: Secondary | ICD-10-CM

## 2022-09-29 DIAGNOSIS — R69 Illness, unspecified: Secondary | ICD-10-CM | POA: Diagnosis not present

## 2022-09-29 DIAGNOSIS — M79604 Pain in right leg: Secondary | ICD-10-CM | POA: Diagnosis not present

## 2022-09-29 NOTE — Progress Notes (Signed)
Subjective:    Patient ID: Kelly [name], female    DOB: [birth year], 47 y.o.   MRN: [id number]  Patient here for  Chief Complaint  Patient presents with   Medical Management of Chronic Issues    HPI Here to follow up regarding hypercholesterolemia. Reports she is doing relatively well.  Trying to stay active.  Concerned regarding her weight.  Discussed diet and exercise.  Had previously been on mounjaro.  Tolerated.  Issues with insurance coverage.  Not diabetic.  Discussed wegovy.  Has changed insurance.  Handling stress.  Continues on zoloft. Feels doing ok on this medication/dose.  On symbicort and singulair - breathing stable.  Covid 05/2022.  No residual problems reported.  [place] .  With history of back issues - back/right leg.  Request referral to Dr Tonita Cong.    Past Medical History:  Diagnosis Date   ADHD    Allergy    Anxiety    Asthma    COVID-19    x 3 as of 06/01/22   Fatigue    GERD (gastroesophageal reflux disease)    Past Surgical History:  Procedure Laterality Date   LAMINECTOMY     Family History  Problem Relation Age of Onset   Hypertension Mother    Non-Hodgkin's lymphoma Father    Lymphoma Father    Cancer Father    Cancer - Cervical Father    Breast cancer Maternal Aunt    Colon polyps Neg Hx    Colon cancer Neg Hx    Rectal cancer Neg Hx    Stomach cancer Neg Hx    Social History   Socioeconomic History   Marital status: Married    Spouse name: Not on file   Number of children: Not on file   Years of education: Not on file   Highest education level: Not on file  Occupational History   Not on file  Tobacco Use   Smoking status: Never   Smokeless tobacco: Never  Vaping Use   Vaping Use: Never used  Substance and Sexual Activity   Alcohol use: No   Drug use: No   Sexual activity: Yes    Birth control/protection: I.U.D.    Comment: mierna  Other Topics Concern   Not on file  Social History Narrative    Marketing executive own business    Social Determinants of Health   Financial Resource Strain: Low Risk  (08/04/2021)   Overall Financial Resource Strain (CARDIA)    Difficulty of Paying Living Expenses: Not hard at all  Food Insecurity: Not on file  Transportation Needs: Not on file  Physical Activity: Not on file  Stress: Not on file  Social Connections: Not on file     Review of Systems  Constitutional:  Negative for appetite change and unexpected weight change.  HENT:  Negative for congestion and sinus pressure.   Respiratory:  Negative for cough, chest tightness and shortness of breath.   Cardiovascular:  Negative for chest pain, palpitations and leg swelling.  Gastrointestinal:  Negative for abdominal pain, diarrhea, nausea and vomiting.  Genitourinary:  Negative for difficulty urinating and dysuria.  Musculoskeletal:  Negative for joint swelling and myalgias.  Skin:  Negative for color change and rash.  Neurological:  Negative for dizziness and headaches.  Psychiatric/Behavioral:  Negative for agitation and dysphoric mood.        Objective:     BP 120/76   Pulse 89   Temp 98 F (  36.7 C)   Resp 16   Ht '5\' 3"'$  (1.6 m)   Wt 191 lb (86.6 kg)   SpO2 99%   BMI 33.83 kg/m  Wt Readings from Last 3 Encounters:  09/29/22 191 lb (86.6 kg)  06/01/22 190 lb (86.2 kg)  04/23/22 190 lb 9.6 oz (86.5 kg)    Physical Exam Vitals reviewed.  Constitutional:      General: She is not in acute distress.    Appearance: Normal appearance.  HENT:     Head: Normocephalic and atraumatic.     Right Ear: External ear normal.     Left Ear: External ear normal.  Eyes:     General: No scleral icterus.       Right eye: No discharge.        Left eye: No discharge.     Conjunctiva/sclera: Conjunctivae normal.  Neck:     Thyroid: No thyromegaly.  Cardiovascular:     Rate and Rhythm: Normal rate and regular rhythm.  Pulmonary:     Effort: No respiratory distress.     Breath  sounds: Normal breath sounds. No wheezing.  Abdominal:     General: Bowel sounds are normal.     Palpations: Abdomen is soft.     Tenderness: There is no abdominal tenderness.  Musculoskeletal:        General: No swelling or tenderness.     Cervical back: Neck supple. No tenderness.  Lymphadenopathy:     Cervical: No cervical adenopathy.  Skin:    Findings: No erythema or rash.  Neurological:     Mental Status: She is alert.  Psychiatric:        Mood and Affect: Mood normal.        Behavior: Behavior normal.      Outpatient Encounter Medications as of 09/29/2022  Medication Sig   amphetamine-dextroamphetamine (ADDERALL) 20 MG tablet Take 20 mg by mouth as needed. One tablet daily as needed   budesonide-formoterol (SYMBICORT) 80-4.5 MCG/ACT inhaler Inhale 2 puffs into the lungs 2 (two) times daily.   cetirizine (ZYRTEC) 10 MG tablet Take 10 mg by mouth daily.   levonorgestrel (MIRENA) 20 MCG/24HR IUD 1 each by Intrauterine route once.   montelukast (SINGULAIR) 10 MG tablet TAKE 1 TABLET BY MOUTH AT BEDTIME   omeprazole (PRILOSEC) 20 MG capsule TAKE 1 CAPSULE BY MOUTH ONCE DAILY   sertraline (ZOLOFT) 100 MG tablet TAKE 1 TABLET BY MOUTH ONCE A DAY   [DISCONTINUED] albuterol (PROVENTIL HFA;VENTOLIN HFA) 108 (90 Base) MCG/ACT inhaler INHALE 2 PUFFS BY MOUTH INTO THE LUNGS EVERY 6 HOURS AS NEEDED FOR WHEEZING OR SHORTNESS OF BREATH   [DISCONTINUED] Semaglutide-Weight Management (WEGOVY) 0.25 MG/0.5ML SOAJ Inject 0.25 mg into the skin once a week. (Patient not taking: Reported on 06/01/2022)   [DISCONTINUED] sodium chloride (OCEAN) 0.65 % SOLN nasal spray Place 2 sprays into both nostrils as needed for congestion.   [DISCONTINUED] Testosterone POWD Apply 2 clicks or 0.5 mL daily to inner thighs   No facility-administered encounter medications on file as of 09/29/2022.     Lab Results  Component Value Date   WBC 6.6 04/23/2022   HGB 14.5 04/23/2022   HCT 43.9 04/23/2022   PLT 208.0  04/23/2022   GLUCOSE CANCELED 05/18/2022   CHOL 199 04/23/2022   TRIG 121.0 04/23/2022   HDL 46.60 04/23/2022   LDLDIRECT 115.0 02/05/2021   LDLCALC 129 (H) 04/23/2022   ALT 23 04/23/2022   AST 16 04/23/2022   NA 138 04/23/2022  K 4.3 04/23/2022   CL 107 04/23/2022   CREATININE 0.76 04/23/2022   BUN 12 04/23/2022   CO2 23 04/23/2022   TSH 2.04 04/23/2022   INR 1.0 04/12/2018   HGBA1C 5.6 05/18/2022    MM DIAG BREAST TOMO UNI LEFT  Result Date: 02/04/2021 CLINICAL DATA:  Callback from screening mammogram for possible asymmetry left breast EXAM: DIGITAL DIAGNOSTIC UNILATERAL LEFT MAMMOGRAM WITH TOMOSYNTHESIS AND CAD TECHNIQUE: Left digital diagnostic mammography and breast tomosynthesis was performed. The images were evaluated with computer-aided detection. COMPARISON:  Prior films ACR Breast Density Category b: There are scattered areas of fibroglandular density. FINDINGS: Lateral view of left breast, spot compression left cc view are submitted. The previously questioned asymmetry does not persist on additional views. No suspicious abnormality is identified. IMPRESSION: Negative. RECOMMENDATION: Routine screening mammogram back schedule. I have discussed the findings and recommendations with the patient. If applicable, a reminder letter will be sent to the patient regarding the next appointment. BI-RADS CATEGORY  1: Negative. Electronically Signed   By: Abelardo Diesel M.D.   On: 02/04/2021 15:31      Assessment & [name]:  Hypercholesterolemia Assessment & [name]: Low cholesterol diet and exercise.  Follow lipid panel.    Need for immunization against influenza -     Flu Vaccine QUAD 33moIM (Fluarix, Fluzone & Alfiuria Quad PF)  Anemia, unspecified type Assessment & [name]: Follow cbc.    Anxiety Assessment & [name]: Continue zoloft.  Overall appears to be handling things relatively well.  Follow.    Moderate persistent asthma without complication Assessment & [name]: Continues on  symbicort and singulair.  Breathing stable.  Follow.    Factor 5 Leiden mutation, heterozygous ([facility] Assessment & [name]: Has a documented history of this mutation.  Discuss with pt/hematology - question of any need for further evaluation.     Gastroesophageal reflux disease without esophagitis Assessment & [name]: No upper symptoms reported.  On omeprazole.  S/p EGD 12/21/19   History of abnormal cervical Pap smear Assessment & [name]: [facility]  Per note review, pap 2021 - wnl.    Weight loss counseling, encounter for Assessment & [name]: Previously on mounjaro.  Insurance does not cover.  Did well on the medication.  Discussed wegovy (trial).  Has changed insurance.  Discussed referral back to Catie/pharmacy.  Was previously seeing.  Desires referral.   Orders: -     AMB Referral to Pharmacy Medication Management  Right leg pain Assessment & [name]: History of back issues (previous laminectomy).  With back/right leg pain now.  Request referral to Dr BTonita Cong    Orders: -     Ambulatory referral to Orthopedic Surgery     CEinar Pheasant MD

## 2022-10-02 ENCOUNTER — Encounter: Payer: Self-pay | Admitting: Internal Medicine

## 2022-10-02 DIAGNOSIS — M79604 Pain in right leg: Secondary | ICD-10-CM | POA: Insufficient documentation

## 2022-10-02 NOTE — Assessment & Plan Note (Signed)
Previously on mounjaro.  Insurance does not cover.  Did well on the medication.  Discussed wegovy (trial).  Has changed insurance.  Discussed referral back to Catie/pharmacy.  Was previously seeing.  Desires referral.

## 2022-10-02 NOTE — Assessment & Plan Note (Signed)
Continues on symbicort and singulair.  Breathing stable.  Follow.

## 2022-10-02 NOTE — Assessment & Plan Note (Signed)
Has a documented history of this mutation.  Discuss with pt/hematology - question of any need for further evaluation.

## 2022-10-02 NOTE — Assessment & Plan Note (Signed)
Continue zoloft.  Overall appears to be handling things relatively well.  Follow.

## 2022-10-02 NOTE — Assessment & Plan Note (Signed)
Follow cbc.  

## 2022-10-02 NOTE — Assessment & Plan Note (Signed)
Sees gyn [facility]).  Per note review, pap 2021 - wnl.

## 2022-10-02 NOTE — Assessment & Plan Note (Addendum)
History of back issues (previous laminectomy).  With back/right leg pain now.  Request referral to Dr Tonita Cong.

## 2022-10-02 NOTE — Assessment & Plan Note (Signed)
Low cholesterol diet and exercise.  Follow lipid panel.   

## 2022-10-02 NOTE — Assessment & Plan Note (Signed)
No upper symptoms reported.  On omeprazole.  S/p EGD 12/21/19

## 2022-10-04 ENCOUNTER — Telehealth: Payer: Self-pay

## 2022-10-04 NOTE — Progress Notes (Signed)
   Care Guide Note  10/04/2022 Name: Kelly [name] MRN: [id number] DOB: May 02, 1976  Referred by: [name], MD Reason for referral : Care Coordination (Outreach to schedule with pharm d )   Kelly [name] is a 47 y.o. year old female who is a primary care patient of [name], MD. [name] was referred to the pharmacist for assistance related to  weight management .    An unsuccessful telephone outreach was attempted today to contact the patient who was referred to the pharmacy team for assistance with medication management. Additional attempts will be made to contact the patient.   Noreene Larsson, [place], [place] [address] Direct Dial: [phone] [email or web address]

## 2022-10-04 NOTE — Progress Notes (Signed)
   Care Guide Note  10/04/2022 Name: Kelly [name] MRN: [id number] DOB: [birth year]  Referred by: [name], MD Reason for referral : Care Coordination (Outreach to schedule with pharm d )   Kelly [name] is a 47 y.o. year old female who is a primary care patient of [name], MD. Kelly [name] was referred to the pharmacist for assistance related to DM.    Successful contact was made with the patient to discuss pharmacy services including being ready for the pharmacist to call at least 5 minutes before the scheduled appointment time, to have medication bottles and any blood sugar or blood pressure readings ready for review. The patient agreed to meet with the pharmacist via with the pharmacist via telephone visit on (date/time).  11/03/2022  Kelly [name], [place], [place] [address] Direct Dial: [phone] [name]

## 2022-10-12 DIAGNOSIS — M5451 Vertebrogenic low back pain: Secondary | ICD-10-CM | POA: Diagnosis not present

## 2022-11-03 ENCOUNTER — Other Ambulatory Visit: Payer: 59 | Admitting: Pharmacist

## 2022-11-03 NOTE — Progress Notes (Signed)
   11/03/2022 Name: Kelly [name] MRN: [id number] DOB: February 18, 1976  Chief Complaint  Patient presents with   Medication Management    Kelly [name] is a 47 y.o. year old female who presented for a telephone visit.   They were referred to the pharmacist by their PCP for assistance in managing medication access.    Subjective:  Care Team: Primary Care Provider: [name], MD ; Next Scheduled Visit: 12/28/22  Medication Access/Adherence  Current Pharmacy:  [place], [place] - [address] [place] [address] Phone: [phone] Fax: [phone]   Patient reports affordability concerns with their medications: Yes  Patient reports access/transportation concerns to their pharmacy: No  Patient reports adherence concerns with their medications:  No    Previously on Mounjaro; insurance changed coverage and unable to afford. Had success on GLP1; had lost ~ 10 lbs.    Obesity/Overweight, Complicated by Hypercholesterolemia, Anxiety, GERD:  Current medications: none  Weight Management treatments previously prescribed: phentermine, did not tolerate; would be inappropriate to use bupropion-containing product due to risk of worsening baseline anxiety; also inappropriate to use medication that reduces seizure threshold in combination with patients additional medications  Low cholesterol diet and exercise.    Objective:  Lab Results  Component Value Date   HGBA1C 5.6 05/18/2022    Lab Results  Component Value Date   CREATININE 0.76 04/23/2022   BUN 12 04/23/2022   NA 138 04/23/2022   K 4.3 04/23/2022   CL 107 04/23/2022   CO2 23 04/23/2022    Lab Results  Component Value Date   CHOL 199 04/23/2022   HDL 46.60 04/23/2022   LDLCALC 129 (H) 04/23/2022   LDLDIRECT 115.0 02/05/2021   TRIG 121.0 04/23/2022   CHOLHDL 4 04/23/2022    Medications Reviewed Today     Reviewed by [name], MD (Physician) on  10/02/22 at 1521  Med List Status: <None>   Medication Order Taking? Sig Documenting Provider Last Dose Status Informant  amphetamine-dextroamphetamine (ADDERALL) 20 MG tablet [id number] No Take 20 mg by mouth as needed. One tablet daily as needed [provider] Taking Active Self  budesonide-formoterol (SYMBICORT) 80-4.5 MCG/ACT inhaler [id number]  Inhale 2 puffs into the lungs 2 (two) times daily. [name], MD  Active   cetirizine (ZYRTEC) 10 MG tablet [id number] No Take 10 mg by mouth daily. [provider] Taking Active   levonorgestrel (MIRENA) 20 MCG/24HR IUD [id number] No 1 each by Intrauterine route once. [provider] Taking Active   montelukast (SINGULAIR) 10 MG tablet [id number] No TAKE 1 TABLET BY MOUTH AT BEDTIME [name], MD Taking Active   omeprazole (PRILOSEC) 20 MG capsule [id number] No TAKE 1 CAPSULE BY MOUTH ONCE DAILY [name], MD Taking Active   sertraline (ZOLOFT) 100 MG tablet [id number]  TAKE 1 TABLET BY MOUTH ONCE A DAY [name], MD  Active               Assessment/Plan:   Obesity/Overweight: - Currently unable to achieve goal weight loss of 5-10% through diet and lifestyle modifications alone - Recommend to proceed with PA for [facility].  Will complete via Cover My Meds  Follow Up Plan: pending medication access  Catie Hedwig Morton, PharmD, [place], [place] [facility] [phone]

## 2022-11-05 NOTE — Progress Notes (Signed)
PA submitted via Cover My Meds ([id number]).   Catie Hedwig Morton, PharmD, [place], [place] [facility] [phone]

## 2022-11-26 ENCOUNTER — Other Ambulatory Visit: Payer: Self-pay | Admitting: Internal Medicine

## 2022-12-28 ENCOUNTER — Encounter: Payer: Self-pay | Admitting: Internal Medicine

## 2022-12-28 ENCOUNTER — Ambulatory Visit: Payer: 59 | Admitting: Internal Medicine

## 2022-12-28 VITALS — BP 134/76 | HR 89 | Temp 97.9°F | Resp 16 | Ht 63.0 in | Wt 191.0 lb

## 2022-12-28 DIAGNOSIS — Z713 Dietary counseling and surveillance: Secondary | ICD-10-CM | POA: Diagnosis not present

## 2022-12-28 DIAGNOSIS — Z8742 Personal history of other diseases of the female genital tract: Secondary | ICD-10-CM

## 2022-12-28 DIAGNOSIS — K219 Gastro-esophageal reflux disease without esophagitis: Secondary | ICD-10-CM | POA: Diagnosis not present

## 2022-12-28 DIAGNOSIS — D649 Anemia, unspecified: Secondary | ICD-10-CM

## 2022-12-28 DIAGNOSIS — J4540 Moderate persistent asthma, uncomplicated: Secondary | ICD-10-CM

## 2022-12-28 DIAGNOSIS — F909 Attention-deficit hyperactivity disorder, unspecified type: Secondary | ICD-10-CM

## 2022-12-28 DIAGNOSIS — F419 Anxiety disorder, unspecified: Secondary | ICD-10-CM

## 2022-12-28 DIAGNOSIS — E7800 Pure hypercholesterolemia, unspecified: Secondary | ICD-10-CM

## 2022-12-28 DIAGNOSIS — Z6833 Body mass index (BMI) 33.0-33.9, adult: Secondary | ICD-10-CM | POA: Diagnosis not present

## 2022-12-28 MED ORDER — BUDESONIDE-FORMOTEROL FUMARATE 80-4.5 MCG/ACT IN AERO: 2.0000 | INHALATION_SPRAY | Freq: Two times a day (BID) | RESPIRATORY_TRACT | 1 refills | Status: DC

## 2022-12-28 NOTE — Progress Notes (Signed)
Subjective:    Patient ID: Kelly [name], female    DOB: 12/09/1975, 47 y.o.   MRN: [id number]  Patient here for  Chief Complaint  Patient presents with   Medical Management of Chronic Issues    HPI Here to follow up regarding hypercholesterolemia. Reports she is doing relatively well.  Trying to stay active.  Concerned regarding her weight. Previously on mounjaro.  Tolerated.  Insurance not covering.  Not diabetic.  Discussed diet and exercise.  On zoloft.  Feel this is work well for her.  No chest pain or sob reported.  Breathing stable.  On singulair.  [facility] for gyn care.  Discussed weight management program through [facility].     Past Medical History:  Diagnosis Date   ADHD    Allergy    Anxiety    Asthma    COVID-19    x 3 as of 06/01/22   Fatigue    GERD (gastroesophageal reflux disease)    Past Surgical History:  Procedure Laterality Date   LAMINECTOMY     Family History  Problem Relation Age of Onset   Hypertension Mother    Non-Hodgkin's lymphoma Father    Lymphoma Father    Cancer Father    Cancer - Cervical Father    Breast cancer Maternal Aunt    Colon polyps Neg Hx    Colon cancer Neg Hx    Rectal cancer Neg Hx    Stomach cancer Neg Hx    Social History   Socioeconomic History   Marital status: Married    Spouse name: Not on file   Number of children: Not on file   Years of education: Not on file   Highest education level: Not on file  Occupational History   Not on file  Tobacco Use   Smoking status: Never   Smokeless tobacco: Never  Vaping Use   Vaping Use: Never used  Substance and Sexual Activity   Alcohol use: No   Drug use: No   Sexual activity: Yes    Birth control/protection: I.U.D.    Comment: mierna  Other Topics Concern   Not on file  Social History Narrative   Physiological scientist own business    Social Determinants of Health   Financial Resource Strain: Low Risk  (12/24/2022)   Overall Financial  Resource Strain (CARDIA)    Difficulty of Paying Living Expenses: Not hard at all  Food Insecurity: No Food Insecurity (12/24/2022)   Hunger Vital Sign    Worried About Running Out of Food in the Last Year: Never true    Ran Out of Food in the Last Year: Never true  Transportation Needs: No Transportation Needs (12/24/2022)   PRAPARE - Administrator, Civil Service (Medical): No    Lack of Transportation (Non-Medical): No  Physical Activity: Unknown (12/24/2022)   Exercise Vital Sign    Days of Exercise per Week: 2 days    Minutes of Exercise per Session: Not on file  Stress: No Stress Concern Present (12/24/2022)   [employer] of Occupational Health - Occupational Stress Questionnaire    Feeling of Stress : Only a little  Social Connections: Unknown (12/24/2022)   Social Connection and Isolation Panel [NHANES]    Frequency of Communication with Friends and Family: Three times a week    Frequency of Social Gatherings with Friends and Family: Twice a week    Attends Religious Services: 1 to 4 times per year  Active Member of Clubs or Organizations: Not on file    Attends Banker Meetings: Not on file    Marital Status: Married     Review of Systems  Constitutional:  Negative for appetite change and unexpected weight change.  HENT:  Negative for congestion and sinus pressure.   Respiratory:  Negative for cough, chest tightness and shortness of breath.   Cardiovascular:  Negative for chest pain and palpitations.  Gastrointestinal:  Negative for abdominal pain, diarrhea, nausea and vomiting.  Genitourinary:  Negative for difficulty urinating and dysuria.  Musculoskeletal:  Negative for joint swelling and myalgias.  Skin:  Negative for color change and rash.  Neurological:  Negative for dizziness and headaches.  Psychiatric/Behavioral:  Negative for agitation and dysphoric mood.        Objective:     BP 134/76   Pulse 89   Temp 97.9 F (36.6 C)    Resp 16   Ht 5\' 3"  (1.6 m)   Wt 191 lb (86.6 kg)   SpO2 98%   BMI 33.83 kg/m  Wt Readings from Last 3 Encounters:  12/28/22 191 lb (86.6 kg)  09/29/22 191 lb (86.6 kg)  06/01/22 190 lb (86.2 kg)    Physical Exam Vitals reviewed.  Constitutional:      General: She is not in acute distress.    Appearance: Normal appearance.  HENT:     Head: Normocephalic and atraumatic.     Right Ear: External ear normal.     Left Ear: External ear normal.  Eyes:     General: No scleral icterus.       Right eye: No discharge.        Left eye: No discharge.     Conjunctiva/sclera: Conjunctivae normal.  Neck:     Thyroid: No thyromegaly.  Cardiovascular:     Rate and Rhythm: Normal rate and regular rhythm.  Pulmonary:     Effort: No respiratory distress.     Breath sounds: Normal breath sounds. No wheezing.  Abdominal:     General: Bowel sounds are normal.     Palpations: Abdomen is soft.     Tenderness: There is no abdominal tenderness.  Musculoskeletal:        General: No swelling or tenderness.     Cervical back: Neck supple. No tenderness.  Lymphadenopathy:     Cervical: No cervical adenopathy.  Skin:    Findings: No erythema or rash.  Neurological:     Mental Status: She is alert.  Psychiatric:        Mood and Affect: Mood normal.        Behavior: Behavior normal.      Outpatient Encounter Medications as of 12/28/2022  Medication Sig   amphetamine-dextroamphetamine (ADDERALL) 20 MG tablet Take 20 mg by mouth as needed. One tablet daily as needed   budesonide-formoterol (SYMBICORT) 80-4.5 MCG/ACT inhaler Inhale 2 puffs into the lungs 2 (two) times daily.   cetirizine (ZYRTEC) 10 MG tablet Take 10 mg by mouth daily.   levonorgestrel (MIRENA) 20 MCG/24HR IUD 1 each by Intrauterine route once.   montelukast (SINGULAIR) 10 MG tablet TAKE 1 TABLET BY MOUTH AT BEDTIME   omeprazole (PRILOSEC) 20 MG capsule TAKE 1 CAPSULE BY MOUTH ONCE DAILY   sertraline (ZOLOFT) 100 MG tablet TAKE  ONE TABLET BY MOUTH ONCE A DAY   [DISCONTINUED] budesonide-formoterol (SYMBICORT) 80-4.5 MCG/ACT inhaler Inhale 2 puffs into the lungs 2 (two) times daily.   No facility-administered encounter medications on file  as of 12/28/2022.     Lab Results  Component Value Date   WBC 6.6 04/23/2022   HGB 14.5 04/23/2022   HCT 43.9 04/23/2022   PLT 208.0 04/23/2022   GLUCOSE CANCELED 05/18/2022   CHOL 199 04/23/2022   TRIG 121.0 04/23/2022   HDL 46.60 04/23/2022   LDLDIRECT 115.0 02/05/2021   LDLCALC 129 (H) 04/23/2022   ALT 23 04/23/2022   AST 16 04/23/2022   NA 138 04/23/2022   K 4.3 04/23/2022   CL 107 04/23/2022   CREATININE 0.76 04/23/2022   BUN 12 04/23/2022   CO2 23 04/23/2022   TSH 2.04 04/23/2022   INR 1.0 04/12/2018   HGBA1C 5.6 05/18/2022    MM DIAG BREAST TOMO UNI LEFT  Result Date: 02/04/2021 CLINICAL DATA:  Callback from screening mammogram for possible asymmetry left breast EXAM: DIGITAL DIAGNOSTIC UNILATERAL LEFT MAMMOGRAM WITH TOMOSYNTHESIS AND CAD TECHNIQUE: Left digital diagnostic mammography and breast tomosynthesis was performed. The images were evaluated with computer-aided detection. COMPARISON:  Prior films ACR Breast Density Category b: There are scattered areas of fibroglandular density. FINDINGS: Lateral view of left breast, spot compression left cc view are submitted. The previously questioned asymmetry does not persist on additional views. No suspicious abnormality is identified. IMPRESSION: Negative. RECOMMENDATION: Routine screening mammogram back schedule. I have discussed the findings and recommendations with the patient. If applicable, a reminder letter will be sent to the patient regarding the next appointment. BI-RADS CATEGORY  1: Negative. Electronically Signed   By: Sherian Rein M.D.   On: 02/04/2021 15:31      Assessment & Plan:  BMI 33.0-33.9,adult -     Amb Ref to Medical Weight Management  Weight loss counseling, encounter for Assessment &  Plan: Previously on mounjaro.  Insurance does not cover.  Did well on the medication.  Discussed wegovy (trial).  Has changed insurance. Not covering weight loss medication.  Discussed diet and exercise.  Agreeable to referral to weight loss management program.  Follow.    Hypercholesterolemia Assessment & Plan: Low cholesterol diet and exercise.  Follow lipid panel.    History of abnormal cervical Pap smear Assessment & Plan: Sees gyn [facility]).  Per note review, pap 2021 - wnl.    Gastroesophageal reflux disease without esophagitis Assessment & Plan: No upper symptoms reported.  On omeprazole.  S/p EGD 12/21/19   Moderate persistent asthma without complication Assessment & Plan: Continues on symbicort and singulair.  Breathing stable.  Follow.    Anxiety Assessment & Plan: Continue zoloft.  Overall appears to be handling things relatively well.  Follow.    Anemia, unspecified type Assessment & Plan: Follow cbc.    Attention deficit hyperactivity disorder (ADHD), unspecified ADHD type Assessment & Plan: Has been worked up and diagnosed with ADHD.   Has been on adderall.  Does not take daily.  Follow.    Other orders -     Budesonide-Formoterol Fumarate; Inhale 2 puffs into the lungs 2 (two) times daily.  Dispense: 10.2 g; Refill: 1     Dale Weedpatch, MD

## 2023-01-02 ENCOUNTER — Telehealth: Payer: Self-pay | Admitting: Internal Medicine

## 2023-01-02 ENCOUNTER — Encounter: Payer: Self-pay | Admitting: Internal Medicine

## 2023-01-02 DIAGNOSIS — Z1231 Encounter for screening mammogram for malignant neoplasm of breast: Secondary | ICD-10-CM

## 2023-01-02 NOTE — Assessment & Plan Note (Signed)
Continue zoloft.  Overall appears to be handling things relatively well.  Follow.  

## 2023-01-02 NOTE — Assessment & Plan Note (Signed)
No upper symptoms reported.  On omeprazole.  S/p EGD 12/21/19 

## 2023-01-02 NOTE — Assessment & Plan Note (Signed)
Follow cbc.  

## 2023-01-02 NOTE — Assessment & Plan Note (Signed)
Continues on symbicort and singulair.  Breathing stable.  Follow.  

## 2023-01-02 NOTE — Assessment & Plan Note (Signed)
Sees gyn ([facility]).  Per note review, pap 2021 - wnl.  °

## 2023-01-02 NOTE — Assessment & Plan Note (Signed)
Low cholesterol diet and exercise.  Follow lipid panel.   

## 2023-01-02 NOTE — Assessment & Plan Note (Signed)
Previously on mounjaro.  Insurance does not cover.  Did well on the medication.  Discussed wegovy (trial).  Has changed insurance. Not covering weight loss medication.  Discussed diet and exercise.  Agreeable to referral to weight loss management program.  Follow.

## 2023-01-02 NOTE — Telephone Encounter (Signed)
Overdue mammogram.  [facility] for gyn care.  It appears she is getting her mammograms through [facility].  Need to confirm if we need to order and schedule.  She is overdue.  Thanks.

## 2023-01-02 NOTE — Assessment & Plan Note (Signed)
Has been worked up and diagnosed with ADHD.   Has been on adderall.  Does not take daily.  Follow.  

## 2023-01-04 NOTE — Telephone Encounter (Signed)
LMTCB

## 2023-01-05 NOTE — Telephone Encounter (Signed)
Received last mammo from gyn 2019. Ordered mammo for [employer] and sent pt my chart message.

## 2023-01-05 NOTE — Addendum Note (Signed)
Addended by: Rita Ohara D on: 01/05/2023 01:16 PM   Modules accepted: Orders

## 2023-01-10 ENCOUNTER — Encounter: Payer: Self-pay | Admitting: Internal Medicine

## 2023-01-10 DIAGNOSIS — Z0000 Encounter for general adult medical examination without abnormal findings: Secondary | ICD-10-CM | POA: Insufficient documentation

## 2023-01-31 ENCOUNTER — Encounter: Payer: Self-pay | Admitting: Internal Medicine

## 2023-02-01 ENCOUNTER — Other Ambulatory Visit: Payer: Self-pay

## 2023-02-01 MED ORDER — BUDESONIDE-FORMOTEROL FUMARATE 80-4.5 MCG/ACT IN AERO: 2.0000 | INHALATION_SPRAY | Freq: Two times a day (BID) | RESPIRATORY_TRACT | 1 refills | Status: DC

## 2023-02-01 NOTE — Telephone Encounter (Signed)
[facility]

## 2023-03-30 ENCOUNTER — Encounter (INDEPENDENT_AMBULATORY_CARE_PROVIDER_SITE_OTHER): Payer: Self-pay

## 2023-04-12 DIAGNOSIS — H9202 Otalgia, left ear: Secondary | ICD-10-CM | POA: Diagnosis not present

## 2023-04-12 DIAGNOSIS — J0140 Acute pansinusitis, unspecified: Secondary | ICD-10-CM | POA: Diagnosis not present

## 2023-04-29 ENCOUNTER — Telehealth: Payer: Self-pay | Admitting: Internal Medicine

## 2023-04-29 ENCOUNTER — Telehealth: Payer: 59 | Admitting: Internal Medicine

## 2023-04-29 ENCOUNTER — Telehealth: Payer: Self-pay

## 2023-04-29 DIAGNOSIS — K219 Gastro-esophageal reflux disease without esophagitis: Secondary | ICD-10-CM | POA: Diagnosis not present

## 2023-04-29 DIAGNOSIS — E7800 Pure hypercholesterolemia, unspecified: Secondary | ICD-10-CM | POA: Diagnosis not present

## 2023-04-29 DIAGNOSIS — Z713 Dietary counseling and surveillance: Secondary | ICD-10-CM | POA: Diagnosis not present

## 2023-04-29 DIAGNOSIS — D6851 Activated protein C resistance: Secondary | ICD-10-CM | POA: Diagnosis not present

## 2023-04-29 DIAGNOSIS — J4540 Moderate persistent asthma, uncomplicated: Secondary | ICD-10-CM

## 2023-04-29 DIAGNOSIS — F419 Anxiety disorder, unspecified: Secondary | ICD-10-CM

## 2023-04-29 MED ORDER — BUDESONIDE-FORMOTEROL FUMARATE 80-4.5 MCG/ACT IN AERO: 2.0000 | INHALATION_SPRAY | Freq: Two times a day (BID) | RESPIRATORY_TRACT | 1 refills | Status: DC

## 2023-04-29 NOTE — Telephone Encounter (Signed)
Prescription Request  04/29/2023  LOV: 12/28/2022  What is the name of the medication or equipment? budesonide-formoterol (SYMBICORT) 80-4.5 MCG/ACT inhaler  Have you contacted your pharmacy to request a refill? Yes   Which pharmacy would you like this sent to?   [id number] [address] [place] [address] Phone: [phone] Fax: [phone]    Patient notified that their request is being sent to the clinical staff for review and that they should receive a response within 2 business days.   Please advise at Mobile [phone] (mobile)

## 2023-04-29 NOTE — Telephone Encounter (Signed)
See other note

## 2023-04-29 NOTE — Progress Notes (Unsigned)
Subjective:    Patient ID: Kelly [name], female    DOB: 01-27-1976, 47 y.o.   MRN: [id number]  Patient here for No chief complaint on file.   HPI Here to follow up regarding hypercholesterolemia, GERD and asthma. On symbicort and singulair. On zoloft.    Past Medical History:  Diagnosis Date   ADHD    Allergy    Anxiety    Asthma    COVID-19    x 3 as of 06/01/22   Fatigue    GERD (gastroesophageal reflux disease)    Past Surgical History:  Procedure Laterality Date   LAMINECTOMY     Family History  Problem Relation Age of Onset   Hypertension Mother    Non-Hodgkin's lymphoma Father    Lymphoma Father    Cancer Father    Cancer - Cervical Father    Breast cancer Maternal Aunt    Colon polyps Neg Hx    Colon cancer Neg Hx    Rectal cancer Neg Hx    Stomach cancer Neg Hx    Social History   Socioeconomic History   Marital status: Married    Spouse name: Not on file   Number of children: Not on file   Years of education: Not on file   Highest education level: Not on file  Occupational History   Not on file  Tobacco Use   Smoking status: Never   Smokeless tobacco: Never  Vaping Use   Vaping status: Never Used  Substance and Sexual Activity   Alcohol use: No   Drug use: No   Sexual activity: Yes    Birth control/protection: I.U.D.    Comment: mierna  Other Topics Concern   Not on file  Social History Narrative   Physiological scientist own business    Social Determinants of Health   Financial Resource Strain: Low Risk  (12/24/2022)   Overall Financial Resource Strain (CARDIA)    Difficulty of Paying Living Expenses: Not hard at all  Food Insecurity: No Food Insecurity (12/24/2022)   Hunger Vital Sign    Worried About Running Out of Food in the Last Year: Never true    Ran Out of Food in the Last Year: Never true  Transportation Needs: No Transportation Needs (12/24/2022)   PRAPARE - Administrator, Civil Service (Medical): No    Lack  of Transportation (Non-Medical): No  Physical Activity: Unknown (12/24/2022)   Exercise Vital Sign    Days of Exercise per Week: 2 days    Minutes of Exercise per Session: Not on file  Stress: No Stress Concern Present (12/24/2022)   [employer] of Occupational Health - Occupational Stress Questionnaire    Feeling of Stress : Only a little  Social Connections: Unknown (12/24/2022)   Social Connection and Isolation Panel [NHANES]    Frequency of Communication with Friends and Family: Three times a week    Frequency of Social Gatherings with Friends and Family: Twice a week    Attends Religious Services: 1 to 4 times per year    Active Member of [employer] or Organizations: Not on file    Attends Banker Meetings: Not on file    Marital Status: Married     Review of Systems     Objective:     There were no vitals taken for this visit. Wt Readings from Last 3 Encounters:  12/28/22 191 lb (86.6 kg)  09/29/22 191 lb (86.6 kg)  06/01/22 190 lb (86.2  kg)    Physical Exam   Outpatient Encounter Medications as of 04/29/2023  Medication Sig   amphetamine-dextroamphetamine (ADDERALL) 20 MG tablet Take 20 mg by mouth as needed. One tablet daily as needed   budesonide-formoterol (SYMBICORT) 80-4.5 MCG/ACT inhaler Inhale 2 puffs into the lungs 2 (two) times daily.   cetirizine (ZYRTEC) 10 MG tablet Take 10 mg by mouth daily.   levonorgestrel (MIRENA) 20 MCG/24HR IUD 1 each by Intrauterine route once.   montelukast (SINGULAIR) 10 MG tablet TAKE 1 TABLET BY MOUTH AT BEDTIME   omeprazole (PRILOSEC) 20 MG capsule TAKE 1 CAPSULE BY MOUTH ONCE DAILY   sertraline (ZOLOFT) 100 MG tablet TAKE ONE TABLET BY MOUTH ONCE A DAY   No facility-administered encounter medications on file as of 04/29/2023.     Lab Results  Component Value Date   WBC 6.6 04/23/2022   HGB 14.5 04/23/2022   HCT 43.9 04/23/2022   PLT 208.0 04/23/2022   GLUCOSE CANCELED 05/18/2022   CHOL 199 04/23/2022    TRIG 121.0 04/23/2022   HDL 46.60 04/23/2022   LDLDIRECT 115.0 02/05/2021   LDLCALC 129 (H) 04/23/2022   ALT 23 04/23/2022   AST 16 04/23/2022   NA 138 04/23/2022   K 4.3 04/23/2022   CL 107 04/23/2022   CREATININE 0.76 04/23/2022   BUN 12 04/23/2022   CO2 23 04/23/2022   TSH 2.04 04/23/2022   INR 1.0 04/12/2018   HGBA1C 5.6 05/18/2022    MM DIAG BREAST TOMO UNI LEFT  Result Date: 02/04/2021 CLINICAL DATA:  Callback from screening mammogram for possible asymmetry left breast EXAM: DIGITAL DIAGNOSTIC UNILATERAL LEFT MAMMOGRAM WITH TOMOSYNTHESIS AND CAD TECHNIQUE: Left digital diagnostic mammography and breast tomosynthesis was performed. The images were evaluated with computer-aided detection. COMPARISON:  Prior films ACR Breast Density Category b: There are scattered areas of fibroglandular density. FINDINGS: Lateral view of left breast, spot compression left cc view are submitted. The previously questioned asymmetry does not persist on additional views. No suspicious abnormality is identified. IMPRESSION: Negative. RECOMMENDATION: Routine screening mammogram back schedule. I have discussed the findings and recommendations with the patient. If applicable, a reminder letter will be sent to the patient regarding the next appointment. BI-RADS CATEGORY  1: Negative. Electronically Signed   By: Sherian Rein M.D.   On: 02/04/2021 15:31      Assessment & Plan:  There are no diagnoses linked to this encounter.   Dale Bennett, MD

## 2023-04-29 NOTE — Telephone Encounter (Signed)
Prescription Request  04/29/2023  LOV: Visit date not found  What is the name of the medication or equipment? budesonide-formoterol (SYMBICORT) 80-4.5 MCG/ACT inhaler  Have you contacted your pharmacy to request a refill? Yes   Which pharmacy would you like this sent to?  [facility] [address] [address] [place], [place] Phone: [phone]    Patient notified that their request is being sent to the clinical staff for review and that they should receive a response within 2 business days.   Please advise at Mobile [phone] (mobile)  [facility] [address] [facility] [employer] has not heard back from [place] for several weeks.  Patient states she is out of this medication.  [place] and would like for [place] to please send it to the [facility].

## 2023-04-29 NOTE — Telephone Encounter (Signed)
Refill pended to send at appt.

## 2023-05-01 ENCOUNTER — Encounter: Payer: Self-pay | Admitting: Internal Medicine

## 2023-05-01 NOTE — Progress Notes (Addendum)
Patient ID: Kelly [name], female   DOB: 06/07/1976, 47 y.o.   MRN: [id number]   Virtual Visit via video Note  I connected with Carilyn Goodpasture by a video enabled telemedicine application and verified that I am speaking with the correct person using two identifiers. Location patient: home Location provider: work  Persons participating in the virtual visit: patient, provider  The limitations, risks, security and privacy concerns of performing an evaluation and management service by video and the availability of in person appointments have been discussed.  It has also been discussed with the patient that there may be a patient responsible charge related to this service. The patient expressed understanding and agreed to proceed.    Reason for visit: follow up appt.   HPI: Here to follow up regarding hypercholesterolemia, GERD and asthma. On symbicort and singulair. Breathing is stable if she takes singulair and symbicort regularly.  Has diagnosis of asthma.  Has never had formal evaluation - PFTs.  Request referral to pulmonary.  On zoloft. Taking 1/2 tablet for one week.  Decreased her dose.  Feels she is doing well on this dose.  Walking.  No chest pain reported.  No abdominal pain or bowel change reported.  Omeprazole controls acid reflux.  Has been referred to [facility] weight management.  She plans to call for appt.     ROS: See pertinent positives and negatives per HPI.  Past Medical History:  Diagnosis Date   ADHD    Allergy    Anxiety    Asthma    COVID-19    x 3 as of 06/01/22   Fatigue    GERD (gastroesophageal reflux disease)     Past Surgical History:  Procedure Laterality Date   LAMINECTOMY      Family History  Problem Relation Age of Onset   Hypertension Mother    Non-Hodgkin's lymphoma Father    Lymphoma Father    Cancer Father    Cancer - Cervical Father    Breast cancer Maternal Aunt    Colon polyps Neg Hx    Colon cancer Neg Hx    Rectal cancer Neg Hx     Stomach cancer Neg Hx     SOCIAL HX: reviewed.    Current Outpatient Medications:    amphetamine-dextroamphetamine (ADDERALL) 20 MG tablet, Take 20 mg by mouth as needed. One tablet daily as needed, Disp: , Rfl:    budesonide-formoterol (SYMBICORT) 80-4.5 MCG/ACT inhaler, Inhale 2 puffs into the lungs 2 (two) times daily., Disp: 10.2 g, Rfl: 1   cetirizine (ZYRTEC) 10 MG tablet, Take 10 mg by mouth daily., Disp: , Rfl:    levonorgestrel (MIRENA) 20 MCG/24HR IUD, 1 each by Intrauterine route once., Disp: , Rfl:    montelukast (SINGULAIR) 10 MG tablet, TAKE 1 TABLET BY MOUTH AT BEDTIME, Disp: 30 tablet, Rfl: 12   omeprazole (PRILOSEC) 20 MG capsule, TAKE 1 CAPSULE BY MOUTH ONCE DAILY, Disp: 90 capsule, Rfl: 3   sertraline (ZOLOFT) 100 MG tablet, TAKE ONE TABLET BY MOUTH ONCE A DAY, Disp: 90 tablet, Rfl: 1  EXAM:  GENERAL: alert, oriented, appears well and in no acute distress  HEENT: atraumatic, conjunttiva clear, no obvious abnormalities on inspection of external nose and ears  NECK: normal movements of the head and neck  LUNGS: on inspection no signs of respiratory distress, breathing rate appears normal, no obvious gross SOB, gasping or wheezing  CV: no obvious cyanosis  PSYCH/NEURO: pleasant and cooperative, no obvious depression or anxiety, speech  and thought processing grossly intact  ASSESSMENT AND PLAN:  Discussed the following assessment and plan:  Problem List Items Addressed This Visit     Weight loss counseling, encounter for    Previously on mounjaro.  Insurance does not cover.  Did well on the medication.  Discussed wegovy (trial).  Has changed insurance. Not covering weight loss medication.  Discussed diet and exercise.  Agreeable to referral to weight loss management program.  She plans to call and make appt. Follow.       Hypercholesterolemia - Primary    Low cholesterol diet and exercise.  Follow lipid panel.       GERD (gastroesophageal reflux disease)     No upper symptoms reported.  On omeprazole.  S/p EGD 12/21/19      Factor 5 Leiden mutation, heterozygous [facility])    Has a documented history of this mutation.   Asymptomatic heterozygous Factor V leiden mutation, without personal history DVT, does not need  anticoagulation.         Asthma    Continues on symbicort and singulair.  If takes regularly, breathing controlled.  Request referral to pulmonary. Follow.       Relevant Medications   budesonide-formoterol (SYMBICORT) 80-4.5 MCG/ACT inhaler   Other Relevant Orders   Ambulatory referral to Pulmonology   Anxiety    Continue zoloft.  Overall appears to be handling things relatively well.  Follow.        Return in about 3 months (around 07/30/2023) for follow-up.   I discussed the assessment and treatment plan with the patient. The patient was provided an opportunity to ask questions and all were answered. The patient agreed with the plan and demonstrated an understanding of the instructions.   The patient was advised to call back or seek an in-person evaluation if the symptoms worsen or if the condition fails to improve as anticipated.    Dale Keller, MD

## 2023-05-01 NOTE — Assessment & Plan Note (Signed)
Has a documented history of this mutation.   Asymptomatic heterozygous Factor V leiden mutation, without personal history DVT, does not need  anticoagulation.

## 2023-05-01 NOTE — Assessment & Plan Note (Signed)
No upper symptoms reported.  On omeprazole.  S/p EGD 12/21/19 

## 2023-05-01 NOTE — Assessment & Plan Note (Signed)
Continue zoloft.  Overall appears to be handling things relatively well.  Follow.  

## 2023-05-01 NOTE — Assessment & Plan Note (Signed)
Previously on mounjaro.  Insurance does not cover.  Did well on the medication.  Discussed wegovy (trial).  Has changed insurance. Not covering weight loss medication.  Discussed diet and exercise.  Agreeable to referral to weight loss management program.  She plans to call and make appt. Follow.

## 2023-05-01 NOTE — Assessment & Plan Note (Signed)
Continues on symbicort and singulair.  If takes regularly, breathing controlled.  Request referral to pulmonary. Follow.

## 2023-05-01 NOTE — Assessment & Plan Note (Signed)
Low cholesterol diet and exercise.  Follow lipid panel.   

## 2023-05-04 ENCOUNTER — Telehealth: Payer: Self-pay

## 2023-05-04 NOTE — Telephone Encounter (Signed)
Left voicemail for patient asking her to please call us back to schedule an appointment with Dr. Dale Hagerman.  When patient calls back, Dr. Lorin Picket would like for [place] to please schedule an appointment for a  three month follow-up.

## 2023-05-06 ENCOUNTER — Other Ambulatory Visit: Payer: Self-pay | Admitting: Internal Medicine

## 2023-05-09 ENCOUNTER — Other Ambulatory Visit: Payer: Self-pay | Admitting: Internal Medicine

## 2023-05-30 ENCOUNTER — Encounter: Payer: Self-pay | Admitting: Pulmonary Disease

## 2023-05-30 ENCOUNTER — Other Ambulatory Visit
Admission: RE | Admit: 2023-05-30 | Discharge: 2023-05-30 | Disposition: A | Discharge status: Home / Self Care | Payer: 59 | Source: Ambulatory Visit | Attending: Pulmonary Disease | Admitting: Pulmonary Disease

## 2023-05-30 ENCOUNTER — Ambulatory Visit
Admission: RE | Admit: 2023-05-30 | Discharge: 2023-05-30 | Disposition: A | Discharge status: Home / Self Care | Payer: 59 | Source: Ambulatory Visit | Attending: Pulmonary Disease | Admitting: Pulmonary Disease

## 2023-05-30 ENCOUNTER — Ambulatory Visit: Payer: 59 | Admitting: Pulmonary Disease

## 2023-05-30 VITALS — BP 110/78 | HR 77 | Temp 97.7°F | Ht 63.0 in | Wt 193.4 lb

## 2023-05-30 DIAGNOSIS — J4550 Severe persistent asthma, uncomplicated: Secondary | ICD-10-CM | POA: Insufficient documentation

## 2023-05-30 DIAGNOSIS — R0602 Shortness of breath: Secondary | ICD-10-CM | POA: Diagnosis not present

## 2023-05-30 LAB — CBC WITH DIFFERENTIAL/PLATELET
Abs Immature Granulocytes: 0.02 10*3/uL (ref 0.00–0.07)
Basophils Absolute: 0.1 10*3/uL (ref 0.0–0.1)
Basophils Relative: 1 %
Eosinophils Absolute: 0.2 10*3/uL (ref 0.0–0.5)
Eosinophils Relative: 3 %
HCT: 42.2 % (ref 36.0–46.0)
Hemoglobin: 14 g/dL (ref 12.0–15.0)
Immature Granulocytes: 0 %
Lymphocytes Relative: 35 %
Lymphs Abs: 2.4 10*3/uL (ref 0.7–4.0)
MCH: 29.1 pg (ref 26.0–34.0)
MCHC: 33.2 g/dL (ref 30.0–36.0)
MCV: 87.7 fL (ref 80.0–100.0)
Monocytes Absolute: 0.3 10*3/uL (ref 0.1–1.0)
Monocytes Relative: 5 %
Neutro Abs: 3.8 10*3/uL (ref 1.7–7.7)
Neutrophils Relative %: 56 %
Platelets: 234 10*3/uL (ref 150–400)
RBC: 4.81 MIL/uL (ref 3.87–5.11)
RDW: 12.5 % (ref 11.5–15.5)
WBC: 6.8 10*3/uL (ref 4.0–10.5)
nRBC: 0 % (ref 0.0–0.2)

## 2023-05-30 MED ORDER — BUDESONIDE-FORMOTEROL FUMARATE 160-4.5 MCG/ACT IN AERO: 2.0000 | INHALATION_SPRAY | Freq: Two times a day (BID) | RESPIRATORY_TRACT | 12 refills | Status: DC

## 2023-05-30 NOTE — Progress Notes (Signed)
Synopsis: Referred in by Dale Cabin John, MD   Subjective:   PATIENT ID: Kelly [name] GENDER: female DOB: [birth year], MRN: [id number]  Chief Complaint  Patient presents with   Consult    Occasional shortness of breath on exertion. No cough or wheezing.     HPI Kelly [name] is a pleasant 47 year old female patient with a past medical history of moderate persistent asthma presenting today to the pulmonary clinic to establish care.  She was diagnosed with asthma at the age of 47 years old and initially was symptomatic during exercise and during high pollen season.  However for the past 3 to 4 years her symptoms have been persistent and she was started on Symbicort 80-4.52 puffs twice a day.  Along with albuterol on as-needed basis.  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  She was never hospitalized for asthma in the past and never required intubation.  Her symptoms are usually chest tightness intermittent wheezing.  She does have hypersensitivity to strong perfumes and high humidity as well as smoke.   She is doing well overall however she is using her albuterol inhaler twice a day.  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  ROS All systems were reviewed and are negative except for the above.  Objective:   Vitals:   05/30/23 1444  BP: 110/78  Pulse: 77  Temp: 97.7 F (36.5 C)  TempSrc: Temporal  SpO2: 96%  Weight: 193 lb 6.4 oz (87.7 kg)  Height: 5\' 3"  (1.6 m)   96% on RA BMI Readings from Last 3 Encounters:  05/30/23 34.26 kg/m  12/28/22 33.83 kg/m  09/29/22 33.83 kg/m   Wt Readings from Last 3 Encounters:  05/30/23 193 lb 6.4 oz (87.7 kg)  12/28/22 191 lb (86.6 kg)  09/29/22 191 lb (86.6 kg)    Physical Exam GEN: NAD, Healthy Appearing HEENT: Supple Neck, Reactive Pupils, EOMI  CVS: Normal  S1, Normal S2, RRR, No murmurs or ES appreciated  Lungs: Clear bilateral air entry.  Abdomen: Soft, non tender, non distended, + BS  Extremities: Warm and well perfused, No edema  Skin: No suspicious lesions appreciated  Psych: Normal Affect  Ancillary Information   CBC    Component Value Date/Time   WBC 6.8 05/30/2023 1544   RBC 4.81 05/30/2023 1544   HGB 14.0 05/30/2023 1544   HGB 13.2 04/12/2018 1200   HCT 42.2 05/30/2023 1544   HCT 41.4 04/12/2018 1200   PLT 234 05/30/2023 1544   PLT 209 04/12/2018 1200   MCV 87.7 05/30/2023 1544   MCV 81 04/12/2018 1200   MCH 29.1 05/30/2023 1544   MCHC 33.2 05/30/2023 1544   RDW 12.5 05/30/2023 1544   RDW 19.5 (H) 04/12/2018 1200   LYMPHSABS 2.4 05/30/2023 1544   LYMPHSABS 1.8 04/12/2018 1200   MONOABS 0.3 05/30/2023 1544   EOSABS 0.2 05/30/2023 1544   EOSABS 0.2 04/12/2018 1200   BASOSABS 0.1 05/30/2023 1544   BASOSABS 0.0 04/12/2018 1200        No data to display           Assessment & Plan:  Kelly [name] is a pleasant 47 year old female patient with a past medical history of moderate persistent asthma presenting today to the pulmonary clinic to establish care.  #Moderate persistent Asthma  Using her Albuterol inhaler twice a week. Appears  to be not optimal and might require step up therapy.   []  PFTs  []  CBC w/ diff, Allergen Panel and total IgE  []  Increase Budesonide-Formoterol [Symbicort] to 160-4.5 2 puffs twice a day.  []  CXR  []  C/w With Albuterol As needed.   #Vocal Cord Paralysis - Follows at [facility].   #GERD  Has heart burn and acid taste in mouth intermittently. On Omeprazole taper.    Return in about 3 months (around 08/29/2023).  I spent 60 minutes caring for this patient today, including preparing to see the patient, obtaining a medical history , reviewing a separately obtained history, performing a medically appropriate examination and/or evaluation, counseling and educating the  patient/family/caregiver, ordering medications, tests, or procedures, documenting clinical information in the electronic health record, and independently interpreting results (not separately reported/billed) and communicating results to the patient/family/caregiver  Janann Colonel, MD [facility] 05/30/2023 4:24 PM

## 2023-06-01 LAB — ALLERGEN PANEL (27) + IGE
Alternaria Alternata IgE: <0.1 kU/L
Aspergillus Fumigatus IgE: <0.1 kU/L
Bahia Grass IgE: 1.95 kU/L — AB
Bermuda Grass IgE: 0.76 kU/L — AB
Cat Dander IgE: 1.08 kU/L — AB
Cedar, Mountain IgE: 0.87 kU/L — AB
Cladosporium Herbarum IgE: <0.1 kU/L
Cocklebur IgE: 0.2 kU/L — AB
Cockroach, American IgE: <0.1 kU/L
Common Silver Birch IgE: 0.35 kU/L — AB
D Farinae IgE: <0.1 kU/L
D Pteronyssinus IgE: <0.1 kU/L
Dog Dander IgE: 1.32 kU/L — AB
Elm, American IgE: 0.1 kU/L — AB
Hickory, White IgE: 0.27 kU/L — AB
IgE (Immunoglobulin E), Serum: 90 [IU]/mL (ref 6–495)
Johnson Grass IgE: 0.99 kU/L — AB
Kentucky Bluegrass IgE: 2.41 kU/L — AB
Maple/Box Elder IgE: 0.25 kU/L — AB
Mucor Racemosus IgE: <0.1 kU/L
Oak, White IgE: 0.41 kU/L — AB
Penicillium Chrysogen IgE: <0.1 kU/L
Pigweed, Rough IgE: <0.1 kU/L
Plantain, English IgE: <0.1 kU/L
Ragweed, Short IgE: 1.12 kU/L — AB
Setomelanomma Rostrat: <0.1 kU/L
Timothy Grass IgE: 1.74 kU/L — AB
White Mulberry IgE: <0.1 kU/L

## 2023-06-03 ENCOUNTER — Encounter: Payer: Self-pay | Admitting: Pulmonary Disease

## 2023-06-14 ENCOUNTER — Other Ambulatory Visit: Payer: Self-pay | Admitting: Internal Medicine

## 2023-08-03 ENCOUNTER — Encounter: Payer: Self-pay | Admitting: Internal Medicine

## 2023-08-03 ENCOUNTER — Ambulatory Visit: Payer: 59 | Admitting: Internal Medicine

## 2023-08-03 VITALS — BP 118/72 | HR 77 | Temp 98.6°F | Ht 63.0 in | Wt 184.2 lb

## 2023-08-03 DIAGNOSIS — K219 Gastro-esophageal reflux disease without esophagitis: Secondary | ICD-10-CM | POA: Diagnosis not present

## 2023-08-03 DIAGNOSIS — E7800 Pure hypercholesterolemia, unspecified: Secondary | ICD-10-CM | POA: Diagnosis not present

## 2023-08-03 DIAGNOSIS — F419 Anxiety disorder, unspecified: Secondary | ICD-10-CM

## 2023-08-03 DIAGNOSIS — Z8742 Personal history of other diseases of the female genital tract: Secondary | ICD-10-CM | POA: Diagnosis not present

## 2023-08-03 DIAGNOSIS — R739 Hyperglycemia, unspecified: Secondary | ICD-10-CM | POA: Diagnosis not present

## 2023-08-03 DIAGNOSIS — Z975 Presence of (intrauterine) contraceptive device: Secondary | ICD-10-CM

## 2023-08-03 DIAGNOSIS — J4540 Moderate persistent asthma, uncomplicated: Secondary | ICD-10-CM | POA: Diagnosis not present

## 2023-08-03 DIAGNOSIS — D6851 Activated protein C resistance: Secondary | ICD-10-CM

## 2023-08-03 DIAGNOSIS — D649 Anemia, unspecified: Secondary | ICD-10-CM | POA: Diagnosis not present

## 2023-08-03 NOTE — Progress Notes (Signed)
Subjective:    Patient ID: Kelly [name], female    DOB: 05/02/1976, 47 y.o.   MRN: [id number]  Patient here for  Chief Complaint  Patient presents with   Medical Management of Chronic Issues    HPI Here to follow up regarding hypercholesterolemia, GERD and asthma. On zoloft. Some increased stress recently.  Had decreased dose of zoloft. Back on 100mg  now.  Overall she feels she is doing ok on this dose. Does not feel needs any further intervention at this time. Saw pulmonary 05/30/23 - recommended PFTs and allergy testing. Allergy testing revealed slight increased sensitivity to dog and cat dander, as well as multiple grass type. Also symbicort increased to 160-4.5. CXR - no active cardiopulmonary disease. Going to [facility] weight loss clinic. Has adjusted diet.  Working on diet and exercise. Has IUD. Followed by gyn.    Past Medical History:  Diagnosis Date   ADHD    Allergy    Anxiety    Asthma    COVID-19    x 3 as of 06/01/22   Fatigue    GERD (gastroesophageal reflux disease)    Past Surgical History:  Procedure Laterality Date   LAMINECTOMY     Family History  Problem Relation Age of Onset   Hypertension Mother    Non-Hodgkin's lymphoma Father    Lymphoma Father    Cancer Father    Cancer - Cervical Father    Breast cancer Maternal Aunt    Colon polyps Neg Hx    Colon cancer Neg Hx    Rectal cancer Neg Hx    Stomach cancer Neg Hx    Social History   Socioeconomic History   Marital status: Married    Spouse name: Not on file   Number of children: Not on file   Years of education: Not on file   Highest education level: Not on file  Occupational History   Not on file  Tobacco Use   Smoking status: Never   Smokeless tobacco: Never  Vaping Use   Vaping status: Never Used  Substance and Sexual Activity   Alcohol use: No   Drug use: No   Sexual activity: Yes    Birth control/protection: I.U.D.    Comment: mierna  Other Topics Concern   Not on file   Social History Narrative   Physiological scientist own business    Social Determinants of Health   Financial Resource Strain: Low Risk  (12/24/2022)   Overall Financial Resource Strain (CARDIA)    Difficulty of Paying Living Expenses: Not hard at all  Food Insecurity: No Food Insecurity (12/24/2022)   Hunger Vital Sign    Worried About Running Out of Food in the Last Year: Never true    Ran Out of Food in the Last Year: Never true  Transportation Needs: No Transportation Needs (12/24/2022)   PRAPARE - Administrator, Civil Service (Medical): No    Lack of Transportation (Non-Medical): No  Physical Activity: Unknown (12/24/2022)   Exercise Vital Sign    Days of Exercise per Week: 2 days    Minutes of Exercise per Session: Not on file  Stress: No Stress Concern Present (12/24/2022)   [employer] of Occupational Health - Occupational Stress Questionnaire    Feeling of Stress : Only a little  Social Connections: Unknown (12/24/2022)   Social Connection and Isolation Panel [NHANES]    Frequency of Communication with Friends and Family: Three times a week    Frequency  of Social Gatherings with Friends and Family: Twice a week    Attends Religious Services: 1 to 4 times per year    Active Member of [employer] or Organizations: Not on file    Attends Banker Meetings: Not on file    Marital Status: Married     Review of Systems  Constitutional:  Negative for appetite change and unexpected weight change.  HENT:  Negative for congestion and sinus pressure.   Respiratory:  Negative for cough, chest tightness and shortness of breath.   Cardiovascular:  Negative for chest pain and palpitations.  Gastrointestinal:  Negative for abdominal pain, diarrhea, nausea and vomiting.  Genitourinary:  Negative for difficulty urinating and dysuria.  Musculoskeletal:  Negative for joint swelling and myalgias.  Skin:  Negative for color change and rash.  Neurological:  Negative for  dizziness and headaches.  Psychiatric/Behavioral:  Negative for agitation and dysphoric mood.        Increased stress as outlined.        Objective:     BP 118/72   Pulse 77   Temp 98.6 F (37 C) (Oral)   Ht 5\' 3"  (1.6 m)   Wt 184 lb 3.2 oz (83.6 kg)   SpO2 99%   BMI 32.63 kg/m  Wt Readings from Last 3 Encounters:  08/03/23 184 lb 3.2 oz (83.6 kg)  05/30/23 193 lb 6.4 oz (87.7 kg)  12/28/22 191 lb (86.6 kg)    Physical Exam Vitals reviewed.  Constitutional:      General: She is not in acute distress.    Appearance: Normal appearance.  HENT:     Head: Normocephalic and atraumatic.     Right Ear: External ear normal.     Left Ear: External ear normal.     Mouth/Throat:     Pharynx: No oropharyngeal exudate or posterior oropharyngeal erythema.  Eyes:     General: No scleral icterus.       Right eye: No discharge.        Left eye: No discharge.     Conjunctiva/sclera: Conjunctivae normal.  Neck:     Thyroid: No thyromegaly.  Cardiovascular:     Rate and Rhythm: Normal rate and regular rhythm.  Pulmonary:     Effort: No respiratory distress.     Breath sounds: Normal breath sounds. No wheezing.  Abdominal:     General: Bowel sounds are normal.     Palpations: Abdomen is soft.     Tenderness: There is no abdominal tenderness.  Musculoskeletal:        General: No swelling or tenderness.     Cervical back: Neck supple. No tenderness.  Lymphadenopathy:     Cervical: No cervical adenopathy.  Skin:    Findings: No erythema or rash.  Neurological:     Mental Status: She is alert.  Psychiatric:        Mood and Affect: Mood normal.        Behavior: Behavior normal.      Outpatient Encounter Medications as of 08/03/2023  Medication Sig   amphetamine-dextroamphetamine (ADDERALL) 20 MG tablet Take 20 mg by mouth as needed. One tablet daily as needed   budesonide-formoterol (SYMBICORT) 160-4.5 MCG/ACT inhaler Inhale 2 puffs into the lungs in the morning and at  bedtime.   cetirizine (ZYRTEC) 10 MG tablet Take 10 mg by mouth daily.   levonorgestrel (MIRENA) 20 MCG/24HR IUD 1 each by Intrauterine route once.   montelukast (SINGULAIR) 10 MG tablet TAKE ONE TABLET BY  MOUTH AT BEDTIME   omeprazole (PRILOSEC) 20 MG capsule TAKE ONE CAPSULE BY MOUTH ONCE DAILY   sertraline (ZOLOFT) 100 MG tablet TAKE ONE TABLET BY MOUTH ONCE A DAY   VITAMIN D, CHOLECALCIFEROL, PO Take by mouth.   [DISCONTINUED] budesonide-formoterol (SYMBICORT) 80-4.5 MCG/ACT inhaler Inhale 2 puffs into the lungs 2 (two) times daily.   No facility-administered encounter medications on file as of 08/03/2023.     Lab Results  Component Value Date   WBC 6.8 05/30/2023   HGB 14.0 05/30/2023   HCT 42.2 05/30/2023   PLT 234 05/30/2023   GLUCOSE CANCELED 05/18/2022   CHOL 199 04/23/2022   TRIG 121.0 04/23/2022   HDL 46.60 04/23/2022   LDLDIRECT 115.0 02/05/2021   LDLCALC 129 (H) 04/23/2022   ALT 23 04/23/2022   AST 16 04/23/2022   NA 138 04/23/2022   K 4.3 04/23/2022   CL 107 04/23/2022   CREATININE 0.76 04/23/2022   BUN 12 04/23/2022   CO2 23 04/23/2022   TSH 2.04 04/23/2022   INR 1.0 04/12/2018   HGBA1C 5.6 05/18/2022    DG Chest 2 View  Result Date: 06/15/2023 CLINICAL DATA:  Progressive shortness of breath. EXAM: CHEST - 2 VIEW COMPARISON:  None Available. FINDINGS: The heart size and mediastinal contours are within normal limits. Both lungs are clear. The visualized skeletal structures are unremarkable. IMPRESSION: No active cardiopulmonary disease. Electronically Signed   By: Danae Orleans M.D.   On: 06/15/2023 17:39       Assessment & Plan:  Hypercholesterolemia Assessment & Plan: Low cholesterol diet and exercise.  Follow lipid panel.   Orders: -     Basic metabolic panel; Future -     Hepatic function panel; Future -     Lipid panel; Future -     TSH; Future  Anemia, unspecified type Assessment & Plan: Follow cbc.    Anxiety Assessment &  Plan: Increased stress as outlined.  Recently increased zoloft back to 100mg  q day.  Appears to be doing ok on this dose. Continue zoloft.  Overall appears to be handling things relatively well.  Follow.    Moderate persistent asthma without complication Assessment & Plan: Saw pulmonary 05/30/23 - recommended PFTs and allergy testing. Allergy testing revealed slight increased sensitivity to dog and cat dander, as well as multiple grass type. Also symbicort increased to 160-4.5. CXR - no active cardiopulmonary disease.    Factor 5 Leiden mutation, heterozygous [facility]) Assessment & Plan: Has a documented history of this mutation.   Asymptomatic heterozygous Factor V leiden mutation, without personal history DVT, does not need  anticoagulation.      Gastroesophageal reflux disease without esophagitis Assessment & Plan: No upper symptoms reported.  On omeprazole.  S/p EGD 12/21/19   History of abnormal cervical Pap smear Assessment & Plan: Sees gyn [facility]).  Per note review, pap 2021 - wnl. Continue f/u with gyn. Discusse.d    IUD (intrauterine device) in place Assessment & Plan: Has IUD in place.   Followed by gyn.  PAP 2021 - per note wnl.  Discussed f/u with gyn .   Hyperglycemia Assessment & Plan: Documented history of elevated sugar.  Check fasting glucose and A1c.   Orders: -     Hemoglobin A1c; Future     Dale McIntosh, MD

## 2023-08-07 ENCOUNTER — Encounter: Payer: Self-pay | Admitting: Internal Medicine

## 2023-08-07 DIAGNOSIS — R739 Hyperglycemia, unspecified: Secondary | ICD-10-CM | POA: Insufficient documentation

## 2023-08-07 NOTE — Assessment & Plan Note (Signed)
Has IUD in place.   Followed by gyn.  PAP 2021 - per note wnl.  Discussed f/u with gyn .

## 2023-08-07 NOTE — Assessment & Plan Note (Signed)
Sees gyn [facility]).  Per note review, pap 2021 - wnl. Continue f/u with gyn. Discusse.d

## 2023-08-07 NOTE — Assessment & Plan Note (Signed)
Increased stress as outlined.  Recently increased zoloft back to 100mg  q day.  Appears to be doing ok on this dose. Continue zoloft.  Overall appears to be handling things relatively well.  Follow.

## 2023-08-07 NOTE — Assessment & Plan Note (Signed)
No upper symptoms reported.  On omeprazole.  S/p EGD 12/21/19 

## 2023-08-07 NOTE — Assessment & Plan Note (Signed)
Low cholesterol diet and exercise.  Follow lipid panel.   

## 2023-08-07 NOTE — Assessment & Plan Note (Signed)
Has a documented history of this mutation.   Asymptomatic heterozygous Factor V leiden mutation, without personal history DVT, does not need  anticoagulation.

## 2023-08-07 NOTE — Assessment & Plan Note (Signed)
Saw pulmonary 05/30/23 - recommended PFTs and allergy testing. Allergy testing revealed slight increased sensitivity to dog and cat dander, as well as multiple grass type. Also symbicort increased to 160-4.5. CXR - no active cardiopulmonary disease.

## 2023-08-07 NOTE — Assessment & Plan Note (Signed)
Documented history of elevated sugar.  Check fasting glucose and A1c.

## 2023-08-07 NOTE — Assessment & Plan Note (Signed)
Follow cbc.  

## 2023-08-25 DIAGNOSIS — M5451 Vertebrogenic low back pain: Secondary | ICD-10-CM | POA: Diagnosis not present

## 2023-09-05 DIAGNOSIS — M5451 Vertebrogenic low back pain: Secondary | ICD-10-CM | POA: Diagnosis not present

## 2023-09-06 ENCOUNTER — Encounter: Payer: Self-pay | Admitting: Student in an Organized Health Care Education/Training Program

## 2023-09-06 ENCOUNTER — Ambulatory Visit
Payer: 59 | Attending: Student in an Organized Health Care Education/Training Program | Admitting: Student in an Organized Health Care Education/Training Program

## 2023-09-06 VITALS — BP 128/84 | Temp 98.0°F | Resp 16 | Ht 63.0 in | Wt 178.0 lb

## 2023-09-06 DIAGNOSIS — M722 Plantar fascial fibromatosis: Secondary | ICD-10-CM | POA: Diagnosis not present

## 2023-09-06 DIAGNOSIS — M5416 Radiculopathy, lumbar region: Secondary | ICD-10-CM | POA: Diagnosis not present

## 2023-09-06 DIAGNOSIS — M961 Postlaminectomy syndrome, not elsewhere classified: Secondary | ICD-10-CM | POA: Diagnosis not present

## 2023-09-06 DIAGNOSIS — G8929 Other chronic pain: Secondary | ICD-10-CM | POA: Diagnosis not present

## 2023-09-06 DIAGNOSIS — G894 Chronic pain syndrome: Secondary | ICD-10-CM | POA: Diagnosis not present

## 2023-09-06 NOTE — Progress Notes (Signed)
 Patient: Kelly [name]  Service Category: E/M  Provider: Wallie Sherry, MD  DOB: 1975-09-25  DOS: 09/06/2023  Referring Provider: Glendia Shad, MD  MRN: [id number]  Setting: Ambulatory outpatient  PCP: Glendia Shad, MD  Type: New Patient  Specialty: Interventional Pain Management    Location: Office  Delivery: Face-to-face     Primary Reason(s) for Visit: Encounter for initial evaluation of one or more chronic problems (new to examiner) potentially causing chronic pain, and posing a threat to normal musculoskeletal function. (Level of risk: High) CC: Back Pain (lower)  HPI  [name] is a 48 y.o. year old, female patient, who comes for the first time to our practice referred by Glendia Shad, MD for our initial evaluation of her chronic pain. She has Anxiety; Asthma; BMI 32.0-32.9,adult; GERD (gastroesophageal reflux disease); Rash; Anemia; Tension headache; Vitamin D  deficiency; Factor 5 Leiden mutation, heterozygous (HCC); IUD (intrauterine device) in place; History of abnormal cervical Pap smear; Dysphonia; Glottic insufficiency; Menorrhagia; Hoarseness; ADHD; Hypercholesterolemia; Weight loss counseling, encounter for; Hot flashes; Right leg pain; Healthcare maintenance; Hyperglycemia; Lumbar radiculopathy; Lumbar post-laminectomy syndrome (2006) L4/5; Plantar fasciitis, bilateral; and Chronic pain syndrome on their problem list. Today she comes in for evaluation of her Back Pain (lower)  Pain Assessment: Location: Lower, Left Back Radiating: radiates down to left hip down back of legs to entire foot   effects all toes Onset:  >3 months ago Duration: Chronic pain Quality: Aching, Constant, Numbness, Discomfort, Tingling Severity: 5 /10 (subjective, self-reported pain score)  Effect on ADL: sleepinmg, prolonged walking and standing, ADL's Timing:  worse in the AM Modifying factors: meds, rest, Predsisone tapers when pain started, streching BP: 128/84  HR:    Onset and Duration:  Date of onset: 11 months ago Cause of pain: Unknown Severity: Getting worse, NAS-11 at its worse: 8/10, NAS-11 at its best: 3/10, NAS-11 now: 6/10, and NAS-11 on the average: 6/10 Timing: Morning Aggravating Factors: Bending, Bowel movements, Motion, Prolonged sitting, Prolonged standing, Squatting, Stooping , Twisting, and Walking Alleviating Factors: Medications Associated Problems: Numbness, Tingling, Pain that wakes patient up, and Pain that does not allow patient to sleep Quality of Pain: Burning, Constant, Dreadful, Nagging, Sharp, Shooting, Stabbing, Tingling, Toothache-like, and Uncomfortable Previous Examinations or Tests: MRI scan and X-rays Previous Treatments: Steroid treatments by mouth  [name] is being evaluated for possible interventional pain management therapies for the treatment of her chronic pain.    History of Present Illness   The patient, with a history of lumbar laminectomy in 2006, presents with a chief complaint of tingling and pain radiating from the right hip and buttock down the leg. The symptoms began in February of the previous year and have progressively worsened. Initial management with prednisone provided relief, but subsequent rounds, including two back-to-back nine-day tapers, were ineffective. The patient was then prescribed hydrocodone , meloxicam, and gabapentin , with the latter causing initial discomfort but is now tolerated and deemed necessary by the patient. An MRI was ordered in late December to further investigate the cause of the symptoms.  The patient also reports a history of a back fracture from a fall in fifth grade, which is suspected to be the cause of the noted PARS defect in her lumbar spine The patient has been experiencing plantar fasciitis since the summer, which has not improved despite the use of shoe inserts, creams, and oils.  The patient's pain is worse in the morning and at night, making it difficult to find a comfortable sleeping  position. The  patient has been managing the pain with Tylenol  and gabapentin , taking the latter three times a day.  The patient's symptoms have significantly impacted her quality of life, leading to a fear of certain positions due to the associated pain. She tried home exercise program with limited response. The patient has a history of Factor V Leiden, which is managed without the need for blood thinners. The patient's liver function is reported to be normal.      Patient informed that diagnostic tests may be ordered to assist in identifying underlying causes, narrow the list of differential diagnoses and aid in determining candidacy for (or contraindications to) planned therapeutic interventions.   Meds   Current Outpatient Medications:    amphetamine -dextroamphetamine  (ADDERALL) 20 MG tablet, Take 20 mg by mouth as needed. One tablet daily as needed, Disp: , Rfl:    budesonide -formoterol  (SYMBICORT ) 160-4.5 MCG/ACT inhaler, Inhale 2 puffs into the lungs in the morning and at bedtime., Disp: 3 each, Rfl: 12   cetirizine (ZYRTEC) 10 MG tablet, Take 10 mg by mouth daily., Disp: , Rfl:    gabapentin  (NEURONTIN ) 300 MG capsule, Take 300 mg by mouth daily., Disp: , Rfl:    HYDROcodone -acetaminophen  (NORCO/VICODIN) 5-325 MG tablet, Take 1 tablet by mouth every 6 (six) hours as needed., Disp: , Rfl:    levonorgestrel (MIRENA) 20 MCG/24HR IUD, 1 each by Intrauterine route once., Disp: , Rfl:    meloxicam (MOBIC) 15 MG tablet, Take 15 mg by mouth daily., Disp: , Rfl:    montelukast  (SINGULAIR ) 10 MG tablet, TAKE ONE TABLET BY MOUTH AT BEDTIME, Disp: 90 tablet, Rfl: 3   omeprazole  (PRILOSEC) 20 MG capsule, TAKE ONE CAPSULE BY MOUTH ONCE DAILY, Disp: 90 capsule, Rfl: 3   sertraline  (ZOLOFT ) 100 MG tablet, TAKE ONE TABLET BY MOUTH ONCE A DAY, Disp: 90 tablet, Rfl: 1   VITAMIN D , CHOLECALCIFEROL, PO, Take by mouth., Disp: , Rfl:   Imaging Review  DG Lumbar Spine 1 View  Narrative Clinical Data:  48 year old, L4-5 disc herniation. LATERAL LUMBAR SPINE FROM OPERATING ROOM - 1 VIEW, LABELED #2: There are surgical retractors and an instrument marking the L4-5 disc space. Mild spondylolisthesis at L5 with probable bilateral pars defects.  Impression L4-5 marked intraoperatively.  Provider: Candis Molt  MRI lumbar spine 08/25/2023 General: Bilateral pars defect at L5-S1 with grade 2 anterolisthesis of L5 on S1.  Suspected prior posterior decompression at L4-L5.  Multifocal disc degeneration and height loss severe at L5-S1 with Modic type II endplate changes.  Moderate disc degeneration and height loss at L3-L4 and L4-L5.  L3-L4: Mild to moderate disc bulge with annular fissure and small central disc extrusion with caudal migration.  No central canal stenosis or significant foraminal narrowing. L4-L5: Moderate disc bulge with endplate osteophytic ridging and biforaminal extension.  Suspected prior posterior decompression.  Moderate right subarticular recess stenosis with contact of the descending right L5 nerve root.  No significant central canal stenosis.  Mild to moderate bilateral foraminal narrowing.  L5-S1: Bilateral pars defect with grade 2 anterolisthesis of L5 on S1.  Moderate uncovered disc bulge with foraminal extension resulting in severe bilateral foraminal narrowing and impingement of both exiting L5 nerve roots.  Complexity Note: Imaging results reviewed.                         ROS  Cardiovascular: No reported cardiovascular signs or symptoms such as High blood pressure, coronary artery disease, abnormal heart rate or rhythm, heart  attack, blood thinner therapy or heart weakness and/or failure Pulmonary or Respiratory: Wheezing and difficulty taking a deep full breath (Asthma) Neurological: No reported neurological signs or symptoms such as seizures, abnormal skin sensations, urinary and/or fecal incontinence, being born with an abnormal open spine and/or a tethered spinal  cord Psychological-Psychiatric: No reported psychological or psychiatric signs or symptoms such as difficulty sleeping, anxiety, depression, delusions or hallucinations (schizophrenial), mood swings (bipolar disorders) or suicidal ideations or attempts Gastrointestinal: Reflux or heatburn Genitourinary: No reported renal or genitourinary signs or symptoms such as difficulty voiding or producing urine, peeing blood, non-functioning kidney, kidney stones, difficulty emptying the bladder, difficulty controlling the flow of urine, or chronic kidney disease Hematological: No reported hematological signs or symptoms such as prolonged bleeding, low or poor functioning platelets, bruising or bleeding easily, hereditary bleeding problems, low energy levels due to low hemoglobin or being anemic; 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 Grisanti has no known allergies.  Laboratory Chemistry Profile   Renal Lab Results  Component Value Date   BUN 12 04/23/2022   CREATININE 0.76 04/23/2022   BCR 15 04/12/2018   GFR 94.44 04/23/2022   GFRAA 106 04/12/2018   GFRNONAA 92 04/12/2018   SPECGRAV <=1.005 (A) 02/06/2018   PHUR 5.5 02/06/2018   PROTEINUR Negative 02/06/2018     Electrolytes Lab Results  Component Value Date   NA 138 04/23/2022   K 4.3 04/23/2022   CL 107 04/23/2022   CALCIUM 9.7 04/23/2022     Hepatic Lab Results  Component Value Date   AST 16 04/23/2022    ALT 23 04/23/2022   ALBUMIN 4.2 04/23/2022   ALKPHOS 65 04/23/2022     ID Lab Results  Component Value Date   SARSCOV2NAA RESULT: NEGATIVE 12/19/2019     Bone Lab Results  Component Value Date   VD25OH 38.51 04/23/2022     Endocrine Lab Results  Component Value Date   GLUCOSE CANCELED 05/18/2022   HGBA1C 5.6 05/18/2022   TSH 2.04 04/23/2022     Neuropathy Lab Results  Component Value Date   VITAMINB12 374 01/12/2019   FOLATE 8.6 01/12/2019   HGBA1C 5.6 05/18/2022     CNS No results found for: COLORCSF, APPEARCSF, RBCCOUNTCSF, WBCCSF, POLYSCSF, LYMPHSCSF, EOSCSF, PROTEINCSF, GLUCCSF, JCVIRUS, CSFOLI, IGGCSF, LABACHR, ACETBL   Inflammation (CRP: Acute  ESR: Chronic) Lab Results  Component Value Date   CRP <1.0 01/12/2019   ESRSEDRATE 11 01/12/2019     Rheumatology Lab Results  Component Value Date   ANA NEGATIVE 01/12/2019     Coagulation Lab Results  Component Value Date   INR 1.0 04/12/2018   LABPROT 10.1 04/12/2018   APTT 29 04/12/2018   PLT 234 05/30/2023     Cardiovascular Lab Results  Component Value Date   HGB 14.0 05/30/2023   HCT 42.2 05/30/2023     Screening Lab Results  Component Value Date   SARSCOV2NAA RESULT: NEGATIVE 12/19/2019     Cancer No results found for: CEA, CA125, LABCA2   Allergens No results found  for: ALMOND, APPLE, ASPARAGUS, AVOCADO, BANANA, BARLEY, BASIL, BAYLEAF, GREENBEAN, LIMABEAN, WHITEBEAN, BEEFIGE, REDBEET, BLUEBERRY, BROCCOLI, CABBAGE, MELON, CARROT, CASEIN, CASHEWNUT, CAULIFLOWER, CELERY     Note: Lab results reviewed.  PFSH  Drug: Ms. Lamaster  reports no history of drug use. Alcohol:  reports no history of alcohol use. Tobacco:  reports that she has never smoked. She has never used smokeless tobacco. Medical:  has a past medical history of ADHD, Allergy, Anxiety, Asthma, COVID-19, Fatigue, and GERD (gastroesophageal reflux  disease). Family: family history includes Breast cancer in her maternal aunt; Cancer in her father; Cancer - Cervical in her father; Hypertension in her mother; Lymphoma in her father; Non-Hodgkin's lymphoma in her father.  Past Surgical History:  Procedure Laterality Date   LAMINECTOMY     Active Ambulatory Problems    Diagnosis Date Noted   Anxiety 11/23/2016   Asthma 11/23/2016   BMI 32.0-32.9,adult 11/23/2016   GERD (gastroesophageal reflux disease) 11/23/2016   Rash 01/26/2017   Anemia 01/25/2018   Tension headache 01/25/2018   Vitamin D  deficiency 04/19/2018   Factor 5 Leiden mutation, heterozygous (HCC) 04/19/2018   IUD (intrauterine device) in place 01/11/2020   History of abnormal cervical Pap smear 08/31/1995   Dysphonia 06/28/2018   Glottic insufficiency 03/27/2019   Menorrhagia 02/03/2018   Hoarseness 06/30/2020   ADHD 06/30/2020   Hypercholesterolemia 05/16/2021   Weight loss counseling, encounter for 05/03/2022   Hot flashes 05/03/2022   Right leg pain 10/02/2022   Healthcare maintenance 01/10/2023   Hyperglycemia 08/07/2023   Lumbar radiculopathy 09/06/2023   Lumbar post-laminectomy syndrome (2006) L4/5 09/06/2023   Plantar fasciitis, bilateral 09/06/2023   Chronic pain syndrome 09/06/2023   Resolved Ambulatory Problems    Diagnosis Date Noted   Class 1 obesity due to excess calories with body mass index (BMI) of 33.0 to 33.9 in adult 06/02/2020   Past Medical History:  Diagnosis Date   Allergy    COVID-19    Fatigue    Constitutional Exam  General appearance: Well nourished, well developed, and well hydrated. In no apparent acute distress Vitals:   09/06/23 1312  BP: 128/84  Resp: 16  Temp: 98 F (36.7 C)  SpO2: 100%  Weight: 178 lb (80.7 kg)  Height: 5' 3 (1.6 m)   BMI Assessment: Estimated body mass index is 31.53 kg/m as calculated from the following:   Height as of this encounter: 5' 3 (1.6 m).   Weight as of this encounter: 178 lb  (80.7 kg).  BMI interpretation table: BMI level Category Range association with higher incidence of chronic pain  <18 kg/m2 Underweight   18.5-24.9 kg/m2 Ideal body weight   25-29.9 kg/m2 Overweight Increased incidence by 20%  30-34.9 kg/m2 Obese (Class I) Increased incidence by 68%  35-39.9 kg/m2 Severe obesity (Class II) Increased incidence by 136%  >40 kg/m2 Extreme obesity (Class III) Increased incidence by 254%   Patient's current BMI Ideal Body weight  Body mass index is 31.53 kg/m. Ideal body weight: 52.4 kg (115 lb 8.3 oz) Adjusted ideal body weight: 63.7 kg (140 lb 8.2 oz)   BMI Readings from Last 4 Encounters:  09/06/23 31.53 kg/m  08/03/23 32.63 kg/m  05/30/23 34.26 kg/m  12/28/22 33.83 kg/m   Wt Readings from Last 4 Encounters:  09/06/23 178 lb (80.7 kg)  08/03/23 184 lb 3.2 oz (83.6 kg)  05/30/23 193 lb 6.4 oz (87.7 kg)  12/28/22 191 lb (86.6 kg)    Psych/Mental status: Alert, oriented x 3 (person, place, &  time)       Eyes: PERLA Respiratory: No evidence of acute respiratory distress  Lumbar Spine Area Exam  Skin & Axial Inspection: No masses, redness, or swelling Alignment: Symmetrical Functional ROM: Unrestricted ROM       Stability: No instability detected Muscle Tone/Strength: Functionally intact. No obvious neuro-muscular anomalies detected. Sensory (Neurological): Dermatomal pain pattern right L5 Provocative Tests: Hyperextension/rotation test: (+) due to pain. Lumbar quadrant test (Kemp's test): (+) on the right for foraminal stenosis Lateral bending test: (+) ipsilateral radicular pain, on the right. Positive for right-sided foraminal stenosis.  Gait & Posture Assessment  Ambulation: Unassisted Gait: Antalgic Posture: WNL  Lower Extremity Exam    Side: Right lower extremity  Side: Left lower extremity  Stability: No instability observed          Stability: No instability observed          Skin & Extremity Inspection: Skin color,  temperature, and hair growth are WNL. No peripheral edema or cyanosis. No masses, redness, swelling, asymmetry, or associated skin lesions. No contractures.  Skin & Extremity Inspection: Skin color, temperature, and hair growth are WNL. No peripheral edema or cyanosis. No masses, redness, swelling, asymmetry, or associated skin lesions. No contractures.  Functional ROM: Unrestricted ROM                  Functional ROM: Unrestricted ROM                  Muscle Tone/Strength: Functionally intact. No obvious neuro-muscular anomalies detected.  Muscle Tone/Strength: Functionally intact. No obvious neuro-muscular anomalies detected.  Sensory (Neurological): Dermatomal pain pattern        Sensory (Neurological): Myotome pain pattern        DTR: Patellar: deferred today Achilles: deferred today Plantar: deferred today  DTR: Patellar: deferred today Achilles: deferred today Plantar: deferred today  Palpation: No palpable anomalies  Palpation: No palpable anomalies   5 out of 5 strength bilateral lower extremity: Plantar flexion, dorsiflexion, knee flexion, knee extension.   Assessment  Primary Diagnosis & Pertinent Problem List: The primary encounter diagnosis was Chronic radicular lumbar pain (right L5). Diagnoses of Lumbar radiculopathy, Lumbar post-laminectomy syndrome (2006) L4/5, Plantar fasciitis, bilateral, and Chronic pain syndrome were also pertinent to this visit.  Visit Diagnosis (New problems to examiner): 1. Chronic radicular lumbar pain (right L5)   2. Lumbar radiculopathy   3. Lumbar post-laminectomy syndrome (2006) L4/5   4. Plantar fasciitis, bilateral   5. 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 MRI confirms compression of the right  L5 nerve root. Multiple courses of prednisone have failed to relieve symptoms, though Gabapentin  300 mg TID offers some relief. We discussed the risks and benefits of an epidural steroid injection, including potential relief and risks such as infection, bleeding, and headache. We will schedule a right L5 transforaminal epidural steroid injection and continue Gabapentin  300 mg TID, adjusting the dose if sedation occurs. A spinal cord stimulator will be considered if injections prove ineffective, avoiding preemptive spinal fusion surgery due to potential complications and long-term issues.  Plantar fasciitis   She reports chronic bilateral foot pain since summer, worsened by walking and unrelieved by shoe inserts, creams, or oils. We discussed  the benefits and risks of a steroid injection for pain relief, including potential infection and a temporary increase in pain. We will schedule bilateral plantar fascia steroid injections and refer her to podiatry if symptoms persist.      Continue with gabapentin , recently her previous prescriber recommend that she increase her daytime morning dose to 600 mg and continue 300 mg in the afternoon and 300 mg in the evening.  Will monitor and see how she does with this.  Continue with acetaminophen  1000 mg every 8 hours as needed.  Patient will follow back up for a right L5 transforaminal ESI as well as bilateral plantar fascia injections  Procedure Orders         Lumbar Transforaminal Epidural         Injection tendon or ligament      Interventional management options: Ms. Fader was informed that there is no guarantee that she would be a candidate for interventional therapies. The decision will be based on the results of diagnostic studies, as well as Ms. Bezek's risk profile.  Procedure(s) under consideration:  Lumbar epidural steroid injection Spinal cord stimulator trial   Provider-requested follow-up: Return in about 13 days (around 09/19/2023) for  Right L5 TF ESI and B/L plantar fascia injections, in clinic (PO Valium  5mg ).  Future Appointments  Date Time Provider Department Center  11/01/2023  3:00 PM Glendia Shad, MD [id number]    Duration of encounter: .  Total time on encounter, as per AMA guidelines included both the face-to-face and non-face-to-face time personally spent by the physician and/or other qualified health care professional(s) on the day of the encounter (includes time in activities that require the physician or other qualified health care professional and does not include time in activities normally performed by clinical staff). Physician's time may include the following activities when performed: Preparing to see the patient (e.g., pre-charting review of records, searching for previously ordered imaging, lab work, and nerve conduction tests) Review of prior analgesic pharmacotherapies. Reviewing PMP Interpreting ordered tests (e.g., lab work, imaging, nerve conduction tests) Performing post-procedure evaluations, including interpretation of diagnostic procedures Obtaining and/or reviewing separately obtained history Performing a medically appropriate examination and/or evaluation Counseling and educating the patient/family/caregiver Ordering medications, tests, or procedures Referring and communicating with other health care professionals (when not separately reported) Documenting clinical information in the electronic or other health record Independently interpreting results (not separately reported) and communicating results to the patient/ family/caregiver Care coordination (not separately reported)  Note by: Wallie Sherry, MD (AI and TTS technology used. I apologize for any typographical errors that were not detected and corrected.) Date: 09/06/2023; Time: 3:01 PM

## 2023-09-06 NOTE — Patient Instructions (Addendum)
 Moderate Conscious Sedation, Adult, Care After After the procedure, it is common to have: Sleepiness for a few hours. Impaired judgment for a few hours. Trouble with balance. Nausea or vomiting if you eat too soon. Follow these instructions at home: For the time period you were told by your health care provider:  Rest. Do not participate in activities where you could fall or become injured. Do not drive or use machinery. Do not drink alcohol. Do not take sleeping pills or medicines that cause drowsiness. Do not make important decisions or sign legal documents. Do not take care of children on your own. Eating and drinking Follow instructions from your health care provider about what you may eat and drink. Drink enough fluid to keep your urine pale yellow. If you vomit: Drink clear fluids slowly and in small amounts as you are able. Clear fluids include water, ice chips, low-calorie sports drinks, and fruit juice that has water added to it (diluted fruit juice). Eat light and bland foods in small amounts as you are able. These foods include bananas, applesauce, rice, lean meats, toast, and crackers. General instructions Take over-the-counter and prescription medicines only as told by your health care provider. Have a responsible adult stay with you for the time you are told. Do not use any products that contain nicotine or tobacco. These products include cigarettes, chewing tobacco, and vaping devices, such as e-cigarettes. If you need help quitting, ask your health care provider. Return to your normal activities as told by your health care provider. Ask your health care provider what activities are safe for you. Your health care provider may give you more instructions. Make sure you know what you can and cannot do. Contact a health care provider if: You are still sleepy or having trouble with balance after 24 hours. You feel light-headed. You vomit every time you eat or drink. You get  a rash. You have a fever. You have redness or swelling around the IV site. Get help right away if: You have trouble breathing. You start to feel confused at home. These symptoms may be an emergency. Get help right away. Call 911. Do not wait to see if the symptoms will go away. Do not drive yourself to the hospital. This information is not intended to replace advice given to you by your health care provider. Make sure you discuss any questions you have with your health care provider. Document Revised: 03/01/2022 Document Reviewed: 03/01/2022 Elsevier Patient Education  2024 [employer] GENERAL RISKS AND COMPLICATIONS  What are the risk, side effects and possible complications? Generally speaking, most procedures are safe.  However, with any procedure there are risks, side effects, and the possibility of complications.  The risks and complications are dependent upon the sites that are lesioned, or the type of nerve block to be performed.  The closer the procedure is to the spine, the more serious the risks are.  Great care is taken when placing the radio frequency needles, block needles or lesioning probes, but sometimes complications can occur. Infection: Any time there is an injection through the skin, there is a risk of infection.  This is why sterile conditions are used for these blocks.  There are four possible types of infection. Localized skin infection. Central Nervous System Infection-This can be in the form of Meningitis, which can be deadly. Epidural Infections-This can be in the form of an epidural abscess, which can cause pressure inside of the spine, causing compression of the spinal cord with subsequent  paralysis. This would require an emergency surgery to decompress, and there are no guarantees that the patient would recover from the paralysis. Discitis-This is an infection of the intervertebral discs.  It occurs in about 1% of discography procedures.  It is difficult to treat  and it may lead to surgery.        2. Pain: the needles have to go through skin and soft tissues, will cause soreness.       3. Damage to internal structures:  The nerves to be lesioned may be near blood vessels or    other nerves which can be potentially damaged.       4. Bleeding: Bleeding is more common if the patient is taking blood thinners such as  aspirin, Coumadin, Ticiid, Plavix, etc., or if he/she have some genetic predisposition  such as hemophilia. Bleeding into the spinal canal can cause compression of the spinal  cord with subsequent paralysis.  This would require an emergency surgery to  decompress and there are no guarantees that the patient would recover from the  paralysis.       5. Pneumothorax:  Puncturing of a lung is a possibility, every time a needle is introduced in  the area of the chest or upper back.  Pneumothorax refers to free air around the  collapsed lung(s), inside of the thoracic cavity (chest cavity).  Another two possible  complications related to a similar event would include: Hemothorax and Chylothorax.   These are variations of the Pneumothorax, where instead of air around the collapsed  lung(s), you may have blood or chyle, respectively.       6. Spinal headaches: They may occur with any procedures in the area of the spine.       7. Persistent CSF (Cerebro-Spinal Fluid) leakage: This is a rare problem, but may occur  with prolonged intrathecal or epidural catheters either due to the formation of a fistulous  track or a dural tear.       8. Nerve damage: By working so close to the spinal cord, there is always a possibility of  nerve damage, which could be as serious as a permanent spinal cord injury with  paralysis.       9. Death:  Although rare, severe deadly allergic reactions known as Anaphylactic  reaction can occur to any of the medications used.      10. Worsening of the symptoms:  We can always make thing worse.  What are the chances of something like this  happening? Chances of any of this occuring are extremely low.  By statistics, you have more of a chance of getting killed in a motor vehicle accident: while driving to the hospital than any of the above occurring .  Nevertheless, you should be aware that they are possibilities.  In general, it is similar to taking a shower.  Everybody knows that you can slip, hit your head and get killed.  Does that mean that you should not shower again?  Nevertheless always keep in mind that statistics do not mean anything if you happen to be on the wrong side of them.  Even if a procedure has a 1 (one) in a 1,000,000 (million) chance of going wrong, it you happen to be that one..Also, keep in mind that by statistics, you have more of a chance of having something go wrong when taking medications.  Who should not have this procedure? If you are on a blood thinning medication (e.g. Coumadin, Plavix, see  list of Blood Thinners), or if you have an active infection going on, you should not have the procedure.  If you are taking any blood thinners, please inform your physician.  How should I prepare for this procedure? Do not eat or drink anything at least six hours prior to the procedure. Bring a driver with you .  It cannot be a taxi. Come accompanied by an adult that can drive you back, and that is strong enough to help you if your legs get weak or numb from the local anesthetic. Take all of your medicines the morning of the procedure with just enough water to swallow them. If you have diabetes, make sure that you are scheduled to have your procedure done first thing in the morning, whenever possible. If you have diabetes, take only half of your insulin dose and notify our nurse that you have done so as soon as you arrive at the clinic. If you are diabetic, but only take blood sugar pills (oral hypoglycemic), then do not take them on the morning of your procedure.  You may take them after you have had the procedure. Do  not take aspirin or any aspirin-containing medications, at least eleven (11) days prior to the procedure.  They may prolong bleeding. Wear loose fitting clothing that may be easy to take off and that you would not mind if it got stained with Betadine or blood. Do not wear any jewelry or perfume Remove any nail coloring.  It will interfere with some of our monitoring equipment.  NOTE: Remember that this is not meant to be interpreted as a complete list of all possible complications.  Unforeseen problems may occur.  BLOOD THINNERS The following drugs contain aspirin or other products, which can cause increased bleeding during surgery and should not be taken for 2 weeks prior to and 1 week after surgery.  If you should need take something for relief of minor pain, you may take acetaminophen  which is found in Tylenol ,m Datril, Anacin-3 and Panadol. It is not blood thinner. The products listed below are.  Do not take any of the products listed below in addition to any listed on your instruction sheet.  A.P.C or A.P.C with Codeine Codeine Phosphate Capsules #3 Ibuprofen Ridaura  ABC compound Congesprin Imuran rimadil  Advil Cope Indocin Robaxisal  Alka-Seltzer Effervescent Pain Reliever and Antacid Coricidin or Coricidin-D  Indomethacin Rufen  Alka-Seltzer plus Cold Medicine Cosprin Ketoprofen S-A-C Tablets  Anacin Analgesic Tablets or Capsules Coumadin Korlgesic Salflex  Anacin Extra Strength Analgesic tablets or capsules CP-2 Tablets Lanoril Salicylate  Anaprox Cuprimine Capsules Levenox Salocol  Anexsia-D Dalteparin Magan Salsalate  Anodynos Darvon compound Magnesium Salicylate Sine-off  Ansaid Dasin Capsules Magsal Sodium Salicylate  Anturane Depen Capsules Marnal Soma  APF Arthritis pain formula Dewitt's Pills Measurin Stanback  Argesic Dia-Gesic Meclofenamic Sulfinpyrazone  Arthritis Bayer Timed Release Aspirin Diclofenac Meclomen Sulindac  Arthritis pain formula Anacin Dicumarol Medipren  Supac  Analgesic (Safety coated) Arthralgen Diffunasal Mefanamic Suprofen  Arthritis Strength Bufferin Dihydrocodeine Mepro Compound Suprol  Arthropan liquid Dopirydamole Methcarbomol with Aspirin Synalgos  ASA tablets/Enseals Disalcid Micrainin Tagament  Ascriptin Doan's Midol Talwin  Ascriptin A/D Dolene Mobidin Tanderil  Ascriptin Extra Strength Dolobid Moblgesic Ticlid  Ascriptin with Codeine Doloprin or Doloprin with Codeine Momentum Tolectin  Asperbuf Duoprin Mono-gesic Trendar  Aspergum Duradyne Motrin or Motrin IB Triminicin  Aspirin plain, buffered or enteric coated Durasal Myochrisine Trigesic  Aspirin Suppositories Easprin Nalfon Trillsate  Aspirin with Codeine Ecotrin Regular or Extra  Strength Naprosyn Uracel  Atromid-S Efficin Naproxen Ursinus  Auranofin Capsules Elmiron Neocylate Vanquish  Axotal Emagrin Norgesic Verin  Azathioprine Empirin or Empirin with Codeine Normiflo Vitamin E  Azolid Emprazil Nuprin Voltaren  Bayer Aspirin plain, buffered or children's or timed BC Tablets or powders Encaprin Orgaran Warfarin Sodium  Buff-a-Comp Enoxaparin Orudis Zorpin  Buff-a-Comp with Codeine Equegesic Os-Cal-Gesic   Buffaprin Excedrin plain, buffered or Extra Strength Oxalid   Bufferin Arthritis Strength Feldene Oxphenbutazone   Bufferin plain or Extra Strength Feldene Capsules Oxycodone with Aspirin   Bufferin with Codeine Fenoprofen Fenoprofen Pabalate or Pabalate-SF   Buffets II Flogesic Panagesic   Buffinol plain or Extra Strength Florinal or Florinal with Codeine Panwarfarin   Buf-Tabs Flurbiprofen Penicillamine   Butalbital Compound Four-way cold tablets Penicillin   Butazolidin Fragmin Pepto-Bismol   Carbenicillin Geminisyn Percodan   Carna Arthritis Reliever Geopen Persantine   Carprofen Gold's salt Persistin   Chloramphenicol Goody's Phenylbutazone   Chloromycetin Haltrain Piroxlcam   Clmetidine heparin Plaquenil   Cllnoril Hyco-pap Ponstel   Clofibrate Hydroxy  chloroquine Propoxyphen         Before stopping any of these medications, be sure to consult the physician who ordered them.  Some, such as Coumadin (Warfarin) are ordered to prevent or treat serious conditions such as deep thrombosis, pumonary embolisms, and other heart problems.  The amount of time that you may need off of the medication may also vary with the medication and the reason for which you were taking it.  If you are taking any of these medications, please make sure you notify your pain physician before you undergo any procedures.         Epidural Steroid Injection Patient Information  Description: The epidural space surrounds the nerves as they exit the spinal cord.  In some patients, the nerves can be compressed and inflamed by a bulging disc or a tight spinal canal (spinal stenosis).  By injecting steroids into the epidural space, we can bring irritated nerves into direct contact with a potentially helpful medication.  These steroids act directly on the irritated nerves and can reduce swelling and inflammation which often leads to decreased pain.  Epidural steroids may be injected anywhere along the spine and from the neck to the low back depending upon the location of your pain.   After numbing the skin with local anesthetic (like Novocaine), a small needle is passed into the epidural space slowly.  You may experience a sensation of pressure while this is being done.  The entire block usually last less than 10 minutes.  Conditions which may be treated by epidural steroids:  Low back and leg pain Neck and arm pain Spinal stenosis Post-laminectomy syndrome Herpes zoster (shingles) pain Pain from compression fractures  Preparation for the injection:  Do not eat any solid food or dairy products within 8 hours of your appointment.  You may drink clear liquids up to 3 hours before appointment.  Clear liquids include water, black coffee, juice or soda.  No milk or cream  please. You may take your regular medication, including pain medications, with a sip of water before your appointment  Diabetics should hold regular insulin (if taken separately) and take 1/2 normal NPH dos the morning of the procedure.  Carry some sugar containing items with you to your appointment. A driver must accompany you and be prepared to drive you home after your procedure.  Bring all your current medications with your. An IV may be inserted and sedation may  be given at the discretion of the physician.   A blood pressure cuff, EKG and other monitors will often be applied during the procedure.  Some patients may need to have extra oxygen administered for a short period. You will be asked to provide medical information, including your allergies, prior to the procedure.  We must know immediately if you are taking blood thinners (like Coumadin/Warfarin)  Or if you are allergic to IV iodine contrast (dye). We must know if you could possible be pregnant.  Possible side-effects: Bleeding from needle site Infection (rare, may require surgery) Nerve injury (rare) Numbness & tingling (temporary) Difficulty urinating (rare, temporary) Spinal headache ( a headache worse with upright posture) Light -headedness (temporary) Pain at injection site (several days) Decreased blood pressure (temporary) Weakness in arm/leg (temporary) Pressure sensation in back/neck (temporary)  Call if you experience: Fever/chills associated with headache or increased back/neck pain. Headache worsened by an upright position. New onset weakness or numbness of an extremity below the injection site Hives or difficulty breathing (go to the emergency room) Inflammation or drainage at the infection site Severe back/neck pain Any new symptoms which are concerning to you  Please note:  Although the local anesthetic injected can often make your back or neck feel good for several hours after the injection, the pain will  likely return.  It takes 3-7 days for steroids to work in the epidural space.  You may not notice any pain relief for at least that one week.  If effective, we will often do a series of three injections spaced 3-6 weeks apart to maximally decrease your pain.  After the initial series, we generally will wait several months before considering a repeat injection of the same type.  If you have any questions, please call [phone] [facility] ______________________________________________________________________    Preparing for your procedure  Appointments: If you think you may not be able to keep your appointment, call 24-48 hours in advance to cancel. We need time to make it available to others.  Procedure visits are for procedures only. During your procedure appointment there will be: NO Prescription Refills*. NO medication changes or discussions*. NO discussion of disability issues*. NO unrelated pain problem evaluations*. NO evaluations to order other pain procedures*. *These will be addressed at a separate and distinct evaluation encounter on the provider's evaluation schedule and not during procedure days.  Instructions: Food intake: Avoid eating anything solid for at least 8 hours prior to your procedure. Clear liquid intake: You may take clear liquids such as water up to 2 hours prior to your procedure. (No carbonated drinks. No soda.) Transportation: Unless otherwise stated by your physician, bring a driver. (Driver cannot be a Market Researcher, Pharmacist, Community, or any other form of public transportation.) Morning Medicines: Except for blood thinners, take all of your other morning medications with a sip of water. Make sure to take your heart and blood pressure medicines. If your blood pressure's lower number is above 100, the case will be rescheduled. Blood thinners: Make sure to stop your blood thinners as instructed.  If you take a blood thinner, but were not  instructed to stop it, call our office [phone] and ask to talk to a nurse. Not stopping a blood thinner prior to certain procedures could lead to serious complications. Diabetics on insulin: Notify the staff so that you can be scheduled 1st case in the morning. If your diabetes requires high dose insulin, take only  of your normal insulin  dose the morning of the procedure and notify the staff that you have done so. Preventing infections: Shower with an antibacterial soap the morning of your procedure.  Build-up your immune system: Take 1000 mg of Vitamin C with every meal (3 times a day) the day prior to your procedure. Antibiotics: Inform the nursing staff if you are taking any antibiotics or if you have any conditions that may require antibiotics prior to procedures. (Example: recent joint implants)   Pregnancy: If you are pregnant make sure to notify the nursing staff. Not doing so may result in injury to the fetus, including death.  Sickness: If you have a cold, fever, or any active infections, call and cancel or reschedule your procedure. Receiving steroids while having an infection may result in complications. Arrival: You must be in the facility at least 30 minutes prior to your scheduled procedure. Tardiness: Your scheduled time is also the cutoff time. If you do not arrive at least 15 minutes prior to your procedure, you will be rescheduled.  Children: Do not bring any children with you. Make arrangements to keep them home. Dress appropriately: There is always a possibility that your clothing may get soiled. Avoid long dresses. Valuables: Do not bring any jewelry or valuables.  Reasons to call and reschedule or cancel your procedure: (Following these recommendations will minimize the risk of a serious complication.) Surgeries: Avoid having procedures within 2 weeks of any surgery. (Avoid for 2 weeks before or after any surgery). Flu Shots: Avoid having procedures within 2 weeks of a  flu shots or . (Avoid for 2 weeks before or after immunizations). Barium: Avoid having a procedure within 7-10 days after having had a radiological study involving the use of radiological contrast. (Myelograms, Barium swallow or enema study). Heart attacks: Avoid any elective procedures or surgeries for the initial 6 months after a Myocardial Infarction (Heart Attack). Blood thinners: It is imperative that you stop these medications before procedures. Let us  know if you if you take any blood thinner.  Infection: Avoid procedures during or within two weeks of an infection (including chest colds or gastrointestinal problems). Symptoms associated with infections include: Localized redness, fever, chills, night sweats or profuse sweating, burning sensation when voiding, cough, congestion, stuffiness, runny nose, sore throat, diarrhea, nausea, vomiting, cold or Flu symptoms, recent or current infections. It is specially important if the infection is over the area that we intend to treat. Heart and lung problems: Symptoms that may suggest an active cardiopulmonary problem include: cough, chest pain, breathing difficulties or shortness of breath, dizziness, ankle swelling, uncontrolled high or unusually low blood pressure, and/or palpitations. If you are experiencing any of these symptoms, cancel your procedure and contact your primary care physician for an evaluation.  Remember:  Regular Business hours are:  Monday to Thursday 8:00 AM to 4:00 PM  Provider's Schedule: Eric Como, MD:  Procedure days: Tuesday and Thursday 7:30 AM to 4:00 PM  Wallie Sherry, MD:  Procedure days: Monday and Wednesday 7:30 AM to 4:00 PM Last  Updated: 08/09/2023 ______________________________________________________________________

## 2023-09-06 NOTE — Progress Notes (Signed)
 Safety precautions to be maintained throughout the outpatient stay will include: orient to surroundings, keep bed in low position, maintain call bell within reach at all times, provide assistance with transfer out of bed and ambulation.

## 2023-09-13 ENCOUNTER — Other Ambulatory Visit: Payer: Self-pay | Admitting: Internal Medicine

## 2023-09-14 ENCOUNTER — Encounter: Payer: Self-pay | Admitting: Student in an Organized Health Care Education/Training Program

## 2023-09-14 ENCOUNTER — Telehealth: Payer: Self-pay | Admitting: Student in an Organized Health Care Education/Training Program

## 2023-09-14 DIAGNOSIS — G894 Chronic pain syndrome: Secondary | ICD-10-CM

## 2023-09-14 MED ORDER — GABAPENTIN 300 MG PO CAPS: ORAL_CAPSULE | ORAL | 2 refills | Status: DC

## 2023-09-14 NOTE — Telephone Encounter (Signed)
 PT called stated that she wanted to see if she could get a refill on Gabapentin  and Hydrocodone  to help deal with her pain until she comes in for her procedure. Please give patient a call. TY

## 2023-09-21 ENCOUNTER — Ambulatory Visit
Admission: RE | Admit: 2023-09-21 | Discharge: 2023-09-21 | Disposition: A | Discharge status: Home / Self Care | Payer: 59 | Source: Ambulatory Visit | Attending: Student in an Organized Health Care Education/Training Program | Admitting: Student in an Organized Health Care Education/Training Program

## 2023-09-21 ENCOUNTER — Ambulatory Visit
Payer: 59 | Attending: Student in an Organized Health Care Education/Training Program | Admitting: Student in an Organized Health Care Education/Training Program

## 2023-09-21 VITALS — BP 129/96 | Temp 97.5°F | Resp 18 | Ht 63.0 in | Wt 175.0 lb

## 2023-09-21 DIAGNOSIS — M961 Postlaminectomy syndrome, not elsewhere classified: Secondary | ICD-10-CM | POA: Insufficient documentation

## 2023-09-21 DIAGNOSIS — G894 Chronic pain syndrome: Secondary | ICD-10-CM | POA: Diagnosis not present

## 2023-09-21 DIAGNOSIS — M722 Plantar fascial fibromatosis: Secondary | ICD-10-CM | POA: Insufficient documentation

## 2023-09-21 DIAGNOSIS — M5416 Radiculopathy, lumbar region: Secondary | ICD-10-CM | POA: Insufficient documentation

## 2023-09-21 DIAGNOSIS — G8929 Other chronic pain: Secondary | ICD-10-CM | POA: Diagnosis not present

## 2023-09-21 MED ORDER — LIDOCAINE HCL 2 % IJ SOLN
20.0000 mL | Freq: Once | INTRAMUSCULAR | Status: AC
  Administered 2023-09-21: 100 mg
  Filled 2023-09-21: qty 40

## 2023-09-21 MED ORDER — DIAZEPAM 5 MG PO TABS
5.0000 mg | ORAL_TABLET | ORAL | Status: AC
  Administered 2023-09-21: 5 mg via ORAL

## 2023-09-21 MED ORDER — ROPIVACAINE HCL 2 MG/ML IJ SOLN
1.0000 mL | Freq: Once | INTRAMUSCULAR | Status: AC
  Administered 2023-09-21: 1 mL via EPIDURAL
  Filled 2023-09-21: qty 20

## 2023-09-21 MED ORDER — METHYLPREDNISOLONE ACETATE 80 MG/ML IJ SUSP
80.0000 mg | Freq: Once | INTRAMUSCULAR | Status: AC
  Administered 2023-09-21: 80 mg via INTRA_ARTICULAR
  Filled 2023-09-21: qty 1

## 2023-09-21 MED ORDER — DIAZEPAM 5 MG PO TABS
ORAL_TABLET | ORAL | Status: AC
  Filled 2023-09-21: qty 1

## 2023-09-21 MED ORDER — DEXAMETHASONE SODIUM PHOSPHATE 10 MG/ML IJ SOLN
10.0000 mg | Freq: Once | INTRAMUSCULAR | Status: AC
  Administered 2023-09-21: 10 mg
  Filled 2023-09-21: qty 1

## 2023-09-21 MED ORDER — HYDROCODONE-ACETAMINOPHEN 5-325 MG PO TABS: 1.0000 | ORAL_TABLET | Freq: Every day | ORAL | 0 refills | Status: DC | PRN

## 2023-09-21 MED ORDER — IOHEXOL 180 MG/ML  SOLN
10.0000 mL | Freq: Once | INTRAMUSCULAR | Status: AC
  Administered 2023-09-21: 10 mL via EPIDURAL
  Filled 2023-09-21: qty 20

## 2023-09-21 MED ORDER — SODIUM CHLORIDE 0.9% FLUSH
1.0000 mL | Freq: Once | INTRAVENOUS | Status: AC
  Administered 2023-09-21: 1 mL

## 2023-09-21 MED ORDER — ROPIVACAINE HCL 2 MG/ML IJ SOLN: 4.0000 mL | Freq: Once | INTRAMUSCULAR | Status: DC

## 2023-09-21 NOTE — Progress Notes (Signed)
PROVIDER NOTE: Interpretation of information contained herein should be left to medically-trained personnel. Specific patient instructions are provided elsewhere under "Patient Instructions" section of medical record. This document was created in part using STT-dictation technology, any transcriptional errors that may result from this process are unintentional.  Patient: Kelly [name] Type: Established DOB: October 02, 1975 MRN: [id number] PCP: Dale Aurora, MD  Service: Procedure DOS: 09/21/2023 Setting: Ambulatory Location: Ambulatory outpatient facility Delivery: Face-to-face Provider: Edward Jolly, MD Specialty: Interventional Pain Management Specialty designation: 09 Location: Outpatient facility Ref. Prov.: Edward Jolly, MD       Interventional Therapy   Procedure: Lumbar trans-foraminal epidural steroid injection (L-TFESI) #1  Laterality: Right (-RT)  Level: L5 nerve root(s) Imaging: Fluoroscopy-guided         Anesthesia: Local anesthesia (1-2% Lidocaine) Sedation: Minimal Sedation                       DOS: 09/21/2023  Performed by: Edward Jolly, MD  Purpose: Diagnostic/Therapeutic Indications: Lumbar radicular pain severe enough to impact quality of life or function. 1. Chronic radicular lumbar pain (right L5)   2. Lumbar radiculopathy   3. Chronic pain syndrome   4. Plantar fasciitis, bilateral   5. Lumbar post-laminectomy syndrome (2006) L4/5    NAS-11 Pain score:   Pre-procedure: 5 /10   Post-procedure: 4 /10     Position / Prep / Materials:  Position: Prone  Prep solution: ChloraPrep (2% chlorhexidine gluconate and 70% isopropyl alcohol) Prep Area: Entire Posterior Lumbosacral Area.  From the lower tip of the scapula down to the tailbone and from flank to flank. Materials:  Tray: Block Needle(s):  Type: Spinal  Gauge (G): 22  Length: 5-in  Qty: 1      H&P (Pre-op Assessment):  Kelly [name] is a 48 y.o. (year old), female patient, seen today for  interventional treatment. She  has a past surgical history that includes Laminectomy. Kelly [name] has a current medication list which includes the following prescription(s): amphetamine-dextroamphetamine, budesonide-formoterol, cetirizine, gabapentin, hydrocodone-acetaminophen, levonorgestrel, meloxicam, montelukast, omeprazole, sertraline, and cholecalciferol, and the following Facility-Administered Medications: ropivacaine (pf) 2 mg/ml (0.2%). Her primarily concern today is the Back Pain and Foot Pain  Initial Vital Signs:  Pulse/HCG Rate:  ECG Heart Rate: (!) 108 Temp: (!) 97.5 F (36.4 C) Resp: 16 BP: (!) 138/97 SpO2: 99 %  BMI: Estimated body mass index is 31 kg/m as calculated from the following:   Height as of this encounter: 5\' 3"  (1.6 m).   Weight as of this encounter: 175 lb (79.4 kg).  Risk Assessment: Allergies: Reviewed. She has no known allergies.  Allergy Precautions: None required Coagulopathies: Reviewed. None identified.  Blood-thinner therapy: None at this time Active Infection(s): Reviewed. None identified. Kelly [name] is afebrile  Site Confirmation: Kelly [name] was asked to confirm the procedure and laterality before marking the site Procedure checklist: Completed Consent: Before the procedure and under the influence of no sedative(s), amnesic(s), or anxiolytics, the patient was informed of the treatment options, risks and possible complications. To fulfill our ethical and legal obligations, as recommended by the American Medical Association's Code of Ethics, I have informed the patient of my clinical impression; the nature and purpose of the treatment or procedure; the risks, benefits, and possible complications of the intervention; the alternatives, including doing nothing; the risk(s) and benefit(s) of the alternative treatment(s) or procedure(s); and the risk(s) and benefit(s) of doing nothing. The patient was provided information about the general risks and  possible complications  associated with the procedure. These may include, but are not limited to: failure to achieve desired goals, infection, bleeding, organ or nerve damage, allergic reactions, paralysis, and death. In addition, the patient was informed of those risks and complications associated to Spine-related procedures, such as failure to decrease pain; infection (i.e.: Meningitis, epidural or intraspinal abscess); bleeding (i.e.: epidural hematoma, subarachnoid hemorrhage, or any other type of intraspinal or peri-dural bleeding); organ or nerve damage (i.e.: Any type of peripheral nerve, nerve root, or spinal cord injury) with subsequent damage to sensory, motor, and/or autonomic systems, resulting in permanent pain, numbness, and/or weakness of one or several areas of the body; allergic reactions; (i.e.: anaphylactic reaction); and/or death. Furthermore, the patient was informed of those risks and complications associated with the medications. These include, but are not limited to: allergic reactions (i.e.: anaphylactic or anaphylactoid reaction(s)); adrenal axis suppression; blood sugar elevation that in diabetics may result in ketoacidosis or comma; water retention that in patients with history of congestive heart failure may result in shortness of breath, pulmonary edema, and decompensation with resultant heart failure; weight gain; swelling or edema; medication-induced neural toxicity; particulate matter embolism and blood vessel occlusion with resultant organ, and/or nervous system infarction; and/or aseptic necrosis of one or more joints. Finally, the patient was informed that Medicine is not an exact science; therefore, there is also the possibility of unforeseen or unpredictable risks and/or possible complications that may result in a catastrophic outcome. The patient indicated having understood very clearly. We have given the patient no guarantees and we have made no promises. Enough time was  given to the patient to ask questions, all of which were answered to the patient's satisfaction. Kelly [name] has indicated that she wanted to continue with the procedure. Attestation: I, the ordering provider, attest that I have discussed with the patient the benefits, risks, side-effects, alternatives, likelihood of achieving goals, and potential problems during recovery for the procedure that I have provided informed consent. Date  Time: 09/21/2023 12:50 PM   Pre-Procedure Preparation:  Monitoring: As per clinic protocol. Respiration, ETCO2, SpO2, BP, heart rate and rhythm monitor placed and checked for adequate function Safety Precautions: Patient was assessed for positional comfort and pressure points before starting the procedure. Time-out: I initiated and conducted the "Time-out" before starting the procedure, as per protocol. The patient was asked to participate by confirming the accuracy of the "Time Out" information. Verification of the correct person, site, and procedure were performed and confirmed by me, the nursing staff, and the patient. "Time-out" conducted as per Joint Commission's Universal Protocol (UP.01.01.01). Time: 1328 Start Time: 1328 hrs.  Description/Narrative of Procedure:          Target: The 6 o'clock position under the pedicle, on the affected side. Region: Posterolateral Lumbosacral Approach: Posterior Percutaneous Paravertebral approach.  Rationale (medical necessity): procedure needed and proper for the diagnosis and/or treatment of the patient's medical symptoms and needs. Procedural Technique Safety Precautions: Aspiration looking for blood return was conducted prior to all injections. At no point did we inject any substances, as a needle was being advanced. No attempts were made at seeking any paresthesias. Safe injection practices and needle disposal techniques used. Medications properly checked for expiration dates. SDV (single dose vial) medications  used. Description of the Procedure: Protocol guidelines were followed. The patient was placed in position over the procedure table. The target area was identified and the area prepped in the usual manner. Skin & deeper tissues infiltrated with local anesthetic. Appropriate amount of  time allowed to pass for local anesthetics to take effect. The procedure needles were then advanced to the target area. Proper needle placement secured. Negative aspiration confirmed. Solution injected in intermittent fashion, asking for systemic symptoms every 0.5cc of injectate. The needles were then removed and the area cleansed, making sure to leave some of the prepping solution back to take advantage of its long term bactericidal properties.  3 cc solution made of 1 cc of preservative-free saline,1 cc of 0.2% ropivacaine, 1 cc of Decadron 10 mg/cc.   Vitals:   09/21/23 1300 09/21/23 1328 09/21/23 1333 09/21/23 1336  BP: (!) 138/97 (!) 115/94 (!) 159/114 (!) 129/96  Resp: 16 18 17 18   Temp: (!) 97.5 F (36.4 C)     SpO2: 99% 98% 97% 96%  Weight: 175 lb (79.4 kg)     Height: 5\' 3"  (1.6 m)       Start Time: 1328 hrs. End Time: 1335 hrs.  Imaging Guidance (Spinal):          Type of Imaging Technique: Fluoroscopy Guidance (Spinal) Indication(s): Fluoroscopy guidance for needle placement to enhance accuracy in procedures requiring precise needle localization for targeted delivery of medication in or near specific anatomical locations not easily accessible without such real-time imaging assistance. Exposure Time: Please see nurses notes. Contrast: Before injecting any contrast, we confirmed that the patient did not have an allergy to iodine, shellfish, or radiological contrast. Once satisfactory needle placement was completed at the desired level, radiological contrast was injected. Contrast injected under live fluoroscopy. No contrast complications. See chart for type and volume of contrast used. Fluoroscopic  Guidance: I was personally present during the use of fluoroscopy. "Tunnel Vision Technique" used to obtain the best possible view of the target area. Parallax error corrected before commencing the procedure. "Direction-depth-direction" technique used to introduce the needle under continuous pulsed fluoroscopy. Once target was reached, antero-posterior, oblique, and lateral fluoroscopic projection used confirm needle placement in all planes. Images permanently stored in EMR. Interpretation: I personally interpreted the imaging intraoperatively. Adequate needle placement confirmed in multiple planes. Appropriate spread of contrast into desired area was observed. No evidence of afferent or efferent intravascular uptake. No intrathecal or subarachnoid spread observed. Permanent images saved into the patient's record.  Post-operative Assessment:  Post-procedure Vital Signs:  Pulse/HCG Rate:  94 Temp: (!) 97.5 F (36.4 C) Resp: 18 BP: (!) 129/96 SpO2: 96 %  EBL: None  Complications: No immediate post-treatment complications observed by team, or reported by patient.  Note: The patient tolerated the entire procedure well. A repeat set of vitals were taken after the procedure and the patient was kept under observation following institutional policy, for this type of procedure. Post-procedural neurological assessment was performed, showing return to baseline, prior to discharge. The patient was provided with post-procedure discharge instructions, including a section on how to identify potential problems. Should any problems arise concerning this procedure, the patient was given instructions to immediately contact us, at any time, without hesitation. In any case, we plan to contact the patient by telephone for a follow-up status report regarding this interventional procedure.  Comments:  No additional relevant information.  Plan of Care (POC)  Orders:  Orders Placed This Encounter  Procedures   DG PAIN  CLINIC C-ARM 1-60 MIN NO REPORT    Intraoperative interpretation by procedural physician at [facility].    Standing Status:   Standing    Number of Occurrences:   1    Reason for exam::  Assistance in needle guidance and placement for procedures requiring needle placement in or near specific anatomical locations not easily accessible without such assistance.   Compliance Drug Analysis, Ur    Volume: 30 ml(s). Minimum 3 ml of urine is needed. Document temperature of fresh sample. Indications: Long term (current) use of opiate analgesic (Z79.891) Test#: [phone] (Comprehensive Profile)    Release to patient:   Immediate    Medications ordered for procedure: Meds ordered this encounter  Medications   iohexol (OMNIPAQUE) 180 MG/ML injection 10 mL    Must be Myelogram-compatible. If not available, you may substitute with a water-soluble, non-ionic, hypoallergenic, myelogram-compatible radiological contrast medium.   lidocaine (XYLOCAINE) 2 % (with pres) injection 400 mg   sodium chloride flush (NS) 0.9 % injection 1 mL   ropivacaine (PF) 2 mg/mL (0.2%) (NAROPIN) injection 1 mL   dexamethasone (DECADRON) injection 10 mg   methylPREDNISolone acetate (DEPO-MEDROL) injection 80 mg   ropivacaine (PF) 2 mg/mL (0.2%) (NAROPIN) injection 4 mL   diazepam (VALIUM) tablet 5 mg    Make sure Flumazenil is available in the pyxis when using this medication. If oversedation occurs, administer 0.2 mg IV over 15 sec. If after 45 sec no response, administer 0.2 mg again over 1 min; may repeat at 1 min intervals; not to exceed 4 doses (1 mg)   HYDROcodone-acetaminophen (NORCO/VICODIN) 5-325 MG tablet    Sig: Take 1 tablet by mouth daily as needed for severe pain (pain score 7-10). Must last 30 days.    Dispense:  30 tablet    Refill:  0    Chronic Pain: STOP Act (Not applicable) Fill 1 day early if closed on refill date. Avoid benzodiazepines within 8 hours of opioids   Medications  administered: We administered iohexol, lidocaine, sodium chloride flush, ropivacaine (PF) 2 mg/mL (0.2%), dexamethasone, methylPREDNISolone acetate, and diazepam.  See the medical record for exact dosing, route, and time of administration.  Follow-up plan:   Return in about 4 weeks (around 10/19/2023) for PPE IN PERSON.       Right L5 epidural steroid injection, B/L plantar fascial injections    Recent Visits Date Type Provider Dept  09/06/23 Office Visit Edward Jolly, MD [facility]  Showing recent visits within past 90 days and meeting all other requirements Today's Visits Date Type Provider Dept  09/21/23 Procedure visit Edward Jolly, MD [facility]  Showing today's visits and meeting all other requirements Future Appointments Date Type Provider Dept  10/19/23 Appointment Edward Jolly, MD [facility]  Showing future appointments within next 90 days and meeting all other requirements  Disposition: Discharge home  Discharge (Date  Time): 09/21/2023; 1345 hrs.   Primary Care Physician: Dale [place], MD Location: [facility] Note by: Edward Jolly, MD (TTS technology used. I apologize for any typographical errors that were not detected and corrected.) Date: 09/21/2023; Time: 3:48 PM  Disclaimer:  Medicine is not an Visual merchandiser. The only guarantee in medicine is that nothing is guaranteed. It is important to note that the decision to proceed with this intervention was based on the information collected from the patient. The Data and conclusions were drawn from the patient's questionnaire, the interview, and the physical examination. Because the information was provided in large part by the patient, it cannot be guaranteed that it has not been purposely or unconsciously manipulated. Every effort has been made to obtain as much relevant data as possible for this evaluation. It is important to note  that the conclusions that  lead to this procedure are derived in large part from the available data. Always take into account that the treatment will also be dependent on availability of resources and existing treatment guidelines, considered by other Pain Management Practitioners as being common knowledge and practice, at the time of the intervention. For Medico-Legal purposes, it is also important to point out that variation in procedural techniques and pharmacological choices are the acceptable norm. The indications, contraindications, technique, and results of the above procedure should only be interpreted and judged by a Board-Certified Interventional Pain Specialist with extensive familiarity and expertise in the same exact procedure and technique.

## 2023-09-21 NOTE — Progress Notes (Signed)
PROVIDER NOTE: Interpretation of information contained herein should be left to medically-trained personnel. Specific patient instructions are provided elsewhere under "Patient Instructions" section of medical record. This document was created in part using STT-dictation technology, any transcriptional errors that may result from this process are unintentional.  Patient: Kelly [name] Type: Established DOB: 01/23/1976 MRN: [id number] PCP: Kelly Bulpitt, MD  Service: Procedure DOS: 09/21/2023 Setting: Ambulatory Location: Ambulatory outpatient facility Delivery: Face-to-face Provider: Edward Jolly, MD Specialty: Interventional Pain Management Specialty designation: 09 Location: Outpatient facility Ref. Prov.: Kelly Jolly, MD       Interventional Therapy     Procedure:          Anesthesia, Analgesia, Anxiolysis:  Bilateral plantar fascia injections for plantar fasciitis  Type: Local Anesthesia Local Anesthetic: Lidocaine 1-2%   Position: Prone   Bilateral Plantar fasciitis  NAS-11 Pain score:   Pre-procedure: 5 /10   Post-procedure: 4 /10     H&P (Pre-op Assessment):  Ms. Kelly [name] is a 48 y.o. (year old), female patient, seen today for interventional treatment. She  has a past surgical history that includes Laminectomy. Ms. Kelly [name] has a current medication list which includes the following prescription(s): amphetamine-dextroamphetamine, budesonide-formoterol, cetirizine, gabapentin, hydrocodone-acetaminophen, levonorgestrel, meloxicam, montelukast, omeprazole, sertraline, and cholecalciferol, and the following Facility-Administered Medications: ropivacaine (pf) 2 mg/ml (0.2%). Her primarily concern today is the Back Pain and Foot Pain  Initial Vital Signs:  Pulse/HCG Rate:  ECG Heart Rate: (!) 108 Temp: (!) 97.5 F (36.4 C) Resp: 16 BP: (!) 138/97 SpO2: 99 %  BMI: Estimated body mass index is 31 kg/m as calculated from the following:   Height as of this encounter: 5'  3" (1.6 m).   Weight as of this encounter: 175 lb (79.4 kg).  Risk Assessment: Allergies: Reviewed. She has no known allergies.  Allergy Precautions: None required Coagulopathies: Reviewed. None identified.  Blood-thinner therapy: None at this time Active Infection(s): Reviewed. None identified. Ms. Kelly [name] is afebrile  Site Confirmation: Ms. Kelly [name] was asked to confirm the procedure and laterality before marking the site Procedure checklist: Completed Consent: Before the procedure and under the influence of no sedative(s), amnesic(s), or anxiolytics, the patient was informed of the treatment options, risks and possible complications. To fulfill our ethical and legal obligations, as recommended by the American Medical Association's Code of Ethics, I have informed the patient of my clinical impression; the nature and purpose of the treatment or procedure; the risks, benefits, and possible complications of the intervention; the alternatives, including doing nothing; the risk(s) and benefit(s) of the alternative treatment(s) or procedure(s); and the risk(s) and benefit(s) of doing nothing. The patient was provided information about the general risks and possible complications associated with the procedure. These may include, but are not limited to: failure to achieve desired goals, infection, bleeding, organ or nerve damage, allergic reactions, paralysis, and death. In addition, the patient was informed of those risks and complications associated to the procedure, such as failure to decrease pain; infection; bleeding; organ or nerve damage with subsequent damage to sensory, motor, and/or autonomic systems, resulting in permanent pain, numbness, and/or weakness of one or several areas of the body; allergic reactions; (i.e.: anaphylactic reaction); and/or death. Furthermore, the patient was informed of those risks and complications associated with the medications. These include, but are not limited to:  allergic reactions (i.e.: anaphylactic or anaphylactoid reaction(s)); adrenal axis suppression; blood sugar elevation that in diabetics may result in ketoacidosis or comma; water retention that in patients with history of congestive heart failure  may result in shortness of breath, pulmonary edema, and decompensation with resultant heart failure; weight gain; swelling or edema; medication-induced neural toxicity; particulate matter embolism and blood vessel occlusion with resultant organ, and/or nervous system infarction; and/or aseptic necrosis of one or more joints. Finally, the patient was informed that Medicine is not an exact science; therefore, there is also the possibility of unforeseen or unpredictable risks and/or possible complications that may result in a catastrophic outcome. The patient indicated having understood very clearly. We have given the patient no guarantees and we have made no promises. Enough time was given to the patient to ask questions, all of which were answered to the patient's satisfaction. Ms. Kelly [name] has indicated that she wanted to continue with the procedure. Attestation: I, the ordering provider, attest that I have discussed with the patient the benefits, risks, side-effects, alternatives, likelihood of achieving goals, and potential problems during recovery for the procedure that I have provided informed consent. Date  Time: 09/21/2023 12:50 PM  Pre-Procedure Preparation:  Monitoring: As per clinic protocol. Respiration, ETCO2, SpO2, BP, heart rate and rhythm monitor placed and checked for adequate function Safety Precautions: Patient was assessed for positional comfort and pressure points before starting the procedure. Time-out: I initiated and conducted the "Time-out" before starting the procedure, as per protocol. The patient was asked to participate by confirming the accuracy of the "Time Out" information. Verification of the correct person, site, and procedure were  performed and confirmed by me, the nursing staff, and the patient. "Time-out" conducted as per Joint Commission's Universal Protocol (UP.01.01.01). Time: 1328 Start Time: 1328 hrs.  Description of Procedure:          Approach: Plantar approach. Area Prepped: Entire dorsal foot Region ChloraPrep (2% chlorhexidine gluconate and 70% isopropyl alcohol) Procedure Details:  The plantar aspect of both feet was exposed and palpated to identify the point of maximal tenderness along the plantar fascia. The skin overlying the injection sites was cleaned using a chlorhexidine solution in a sterile fashion.   A 25-gauge, 1.5-inch needle was utilized for the procedure. Under palpation guidance, the needle was directed into the area of maximal tenderness at the medial calcaneal tuberosity, targeting the proximal plantar fascia insertion. Injection Details: Solution: A mixture of 1 cc of methylprednisolone, 80 mg/cc with 3 cc of 0.2% ropivacaine.  2 cc injected in each plantar fascia of bilateral feet  Aspiration was performed prior to injection to confirm the absence of intravascular placement.    Vitals:   09/21/23 1300 09/21/23 1328 09/21/23 1333 09/21/23 1336  BP: (!) 138/97 (!) 115/94 (!) 159/114 (!) 129/96  Resp: 16 18 17 18   Temp: (!) 97.5 F (36.4 C)     SpO2: 99% 98% 97% 96%  Weight: 175 lb (79.4 kg)     Height: 5\' 3"  (1.6 m)       Start Time: 1328 hrs. End Time: 1335 hrs.  Antibiotic Prophylaxis:   Anti-infectives (From admission, onward)    None      Indication(s): None identified  Post-operative Assessment:  Post-procedure Vital Signs:  Pulse/HCG Rate:  94 Temp: (!) 97.5 F (36.4 C) Resp: 18 BP: (!) 129/96 SpO2: 96 %  EBL: None  Complications: No immediate post-treatment complications observed by team, or reported by patient.  Note: The patient tolerated the entire procedure well. A repeat set of vitals were taken after the procedure and the patient was kept under  observation following institutional policy, for this type of procedure. Post-procedural neurological assessment was performed,  showing return to baseline, prior to discharge. The patient was provided with post-procedure discharge instructions, including a section on how to identify potential problems. Should any problems arise concerning this procedure, the patient was given instructions to immediately contact us, at any time, without hesitation. In any case, we plan to contact the patient by telephone for a follow-up status report regarding this interventional procedure.  Comments:  No additional relevant information.  Plan of Care (POC)  Orders:  Orders Placed This Encounter  Procedures   DG PAIN CLINIC C-ARM 1-60 MIN NO REPORT    Intraoperative interpretation by procedural physician at [facility].    Standing Status:   Standing    Number of Occurrences:   1    Reason for exam::   Assistance in needle guidance and placement for procedures requiring needle placement in or near specific anatomical locations not easily accessible without such assistance.   Compliance Drug Analysis, Ur    Volume: 30 ml(s). Minimum 3 ml of urine is needed. Document temperature of fresh sample. Indications: Long term (current) use of opiate analgesic (Z79.891) Test#: [id number] (Comprehensive Profile)    Release to patient:   Immediate    Medications ordered for procedure: Meds ordered this encounter  Medications   iohexol (OMNIPAQUE) 180 MG/ML injection 10 mL    Must be Myelogram-compatible. If not available, you may substitute with a water-soluble, non-ionic, hypoallergenic, myelogram-compatible radiological contrast medium.   lidocaine (XYLOCAINE) 2 % (with pres) injection 400 mg   sodium chloride flush (NS) 0.9 % injection 1 mL   ropivacaine (PF) 2 mg/mL (0.2%) (NAROPIN) injection 1 mL   dexamethasone (DECADRON) injection 10 mg   methylPREDNISolone acetate (DEPO-MEDROL) injection 80 mg    ropivacaine (PF) 2 mg/mL (0.2%) (NAROPIN) injection 4 mL   diazepam (VALIUM) tablet 5 mg    Make sure Flumazenil is available in the pyxis when using this medication. If oversedation occurs, administer 0.2 mg IV over 15 sec. If after 45 sec no response, administer 0.2 mg again over 1 min; may repeat at 1 min intervals; not to exceed 4 doses (1 mg)   HYDROcodone-acetaminophen (NORCO/VICODIN) 5-325 MG tablet    Sig: Take 1 tablet by mouth daily as needed for severe pain (pain score 7-10). Must last 30 days.    Dispense:  30 tablet    Refill:  0    Chronic Pain: STOP Act (Not applicable) Fill 1 day early if closed on refill date. Avoid benzodiazepines within 8 hours of opioids   Medications administered: We administered iohexol, lidocaine, sodium chloride flush, ropivacaine (PF) 2 mg/mL (0.2%), dexamethasone, methylPREDNISolone acetate, and diazepam.  See the medical record for exact dosing, route, and time of administration.  Follow-up plan:   Return in about 4 weeks (around 10/19/2023) for PPE IN PERSON.       Recent Visits Date Type Provider Dept  09/06/23 Office Visit Kelly Jolly, MD [facility]  Showing recent visits within past 90 days and meeting all other requirements Today's Visits Date Type Provider Dept  09/21/23 Procedure visit Kelly Jolly, MD [facility]  Showing today's visits and meeting all other requirements Future Appointments Date Type Provider Dept  10/19/23 Appointment Kelly Jolly, MD [facility]  Showing future appointments within next 90 days and meeting all other requirements  Disposition: Discharge home  Discharge (Date  Time): 09/21/2023; 1345 hrs.   Primary Care Physician: Kelly Juliaetta, MD Location: [facility] Note by: Kelly Jolly, MD (TTS  technology used. I apologize for any typographical errors that were not detected and corrected.) Date: 09/21/2023; Time: 3:54 PM  Disclaimer:   Medicine is not an Visual merchandiser. The only guarantee in medicine is that nothing is guaranteed. It is important to note that the decision to proceed with this intervention was based on the information collected from the patient. The Data and conclusions were drawn from the patient's questionnaire, the interview, and the physical examination. Because the information was provided in large part by the patient, it cannot be guaranteed that it has not been purposely or unconsciously manipulated. Every effort has been made to obtain as much relevant data as possible for this evaluation. It is important to note that the conclusions that lead to this procedure are derived in large part from the available data. Always take into account that the treatment will also be dependent on availability of resources and existing treatment guidelines, considered by other Pain Management Practitioners as being common knowledge and practice, at the time of the intervention. For Medico-Legal purposes, it is also important to point out that variation in procedural techniques and pharmacological choices are the acceptable norm. The indications, contraindications, technique, and results of the above procedure should only be interpreted and judged by a Board-Certified Interventional Pain Specialist with extensive familiarity and expertise in the same exact procedure and technique.

## 2023-09-21 NOTE — Progress Notes (Signed)
Safety precautions to be maintained throughout the outpatient stay will include: orient to surroundings, keep bed in low position, maintain call bell within reach at all times, provide assistance with transfer out of bed and ambulation.  

## 2023-09-21 NOTE — Patient Instructions (Signed)
Pain Management Discharge Instructions  General Discharge Instructions :  If you need to reach your doctor call: Monday-Friday 8:00 am - 4:00 pm at [phone] or toll free [phone].  After clinic hours [phone] to have operator reach doctor.  Bring all of your medication bottles to all your appointments in the pain clinic.  To cancel or reschedule your appointment with Pain Management please remember to call 24 hours in advance to avoid a fee.  Refer to the educational materials which you have been given on: General Risks, I had my Procedure. Discharge Instructions, Post Sedation.  Post Procedure Instructions:  The drugs you were given will stay in your system until tomorrow, so for the next 24 hours you should not drive, make any legal decisions or drink any alcoholic beverages.  You may eat anything you prefer, but it is better to start with liquids then soups and crackers, and gradually work up to solid foods.  Please notify your doctor immediately if you have any unusual bleeding, trouble breathing or pain that is not related to your normal pain.  Depending on the type of procedure that was done, some parts of your body may feel week and/or numb.  This usually clears up by tonight or the next day.  Walk with the use of an assistive device or accompanied by an adult for the 24 hours.  You may use ice on the affected area for the first 24 hours.  Put ice in a Ziploc bag and cover with a towel and place against area 15 minutes on 15 minutes off.  You may switch to heat after 24 hours.Selective Nerve Root Block Patient Information  Description: Specific nerve roots exit the spinal canal and these nerves can be compressed and inflamed by a bulging disc and bone spurs.  By injecting steroids on the nerve root, we can potentially decrease the inflammation surrounding these nerves, which often leads to decreased pain.  Also, by injecting local anesthesia on the nerve root, this can  provide us helpful information to give to your referring doctor if it decreases your pain.  Selective nerve root blocks can be done along the spine from the neck to the low back depending on the location of your pain.   After numbing the skin with local anesthesia, a small needle is passed to the nerve root and the position of the needle is verified using x-ray pictures.  After the needle is in correct position, we then deposit the medication.  You may experience a pressure sensation while this is being done.  The entire block usually lasts less than 15 minutes.  Conditions that may be treated with selective nerve root blocks: Low back and leg pain Spinal stenosis Diagnostic block prior to potential surgery Neck and arm pain Post laminectomy syndrome  Preparation for the injection:  Do not eat any solid food or dairy products within 8 hours of your appointment. You may drink clear liquids up to 3 hours before an appointment.  Clear liquids include water, black coffee, juice or soda.  No milk or cream please. You may take your regular medications, including pain medications, with a sip of water before your appointment.  Diabetics should hold regular insulin (if taken separately) and take 1/2 normal NPH dose the morning of the procedure.  Carry some sugar containing items with you to your appointment. A driver must accompany you and be prepared to drive you home after your procedure. Bring all your current medications with you. An IV   may be inserted and sedation may be given at the discretion of the physician. A blood pressure cuff, EKG, and other monitors will often be applied during the procedure.  Some patients may need to have extra oxygen administered for a short period. You will be asked to provide medical information, including allergies, prior to the procedure.  We must know immediately if you are taking blood  Thinners (like Coumadin) or if you are allergic to IV iodine contrast  (dye).  Possible side-effects: All are usually temporary Bleeding from needle site Light headedness Numbness and tingling Decreased blood pressure Weakness in arms/legs Pressure sensation in back/neck Pain at injection site (several days)  Possible complications: All are extremely rare Infection Nerve injury Spinal headache (a headache wore with upright position)  Call if you experience: Fever/chills associated with headache or increased back/neck pain Headache worsened by an upright position New onset weakness or numbness of an extremity below the injection site Hives or difficulty breathing (go to the emergency room) Inflammation or drainage at the injection site(s) Severe back/neck pain greater than usual New symptoms which are concerning to you  Please note:  Although the local anesthetic injected can often make your back or neck feel good for several hours after the injection the pain will likely return.  It takes 3-5 days for steroids to work on the nerve root. You may not notice any pain relief for at least one week.  If effective, we will often do a series of 3 injections spaced 3-6 weeks apart to maximally decrease your pain.    If you have any questions, please call [phone]  [facility] 

## 2023-09-22 ENCOUNTER — Telehealth: Payer: Self-pay

## 2023-09-22 DIAGNOSIS — G894 Chronic pain syndrome: Secondary | ICD-10-CM | POA: Diagnosis not present

## 2023-09-22 NOTE — Telephone Encounter (Signed)
Post procedure follow up.  Patient states she is doing good.  

## 2023-09-26 LAB — COMPLIANCE DRUG ANALYSIS, UR

## 2023-10-10 ENCOUNTER — Other Ambulatory Visit (HOSPITAL_COMMUNITY): Payer: Self-pay

## 2023-10-10 ENCOUNTER — Telehealth: Payer: Self-pay | Admitting: Pharmacy Technician

## 2023-10-10 NOTE — Telephone Encounter (Signed)
 Pharmacy Patient Advocate Encounter  Received notification from [facility] that Prior Authorization for Omeprazole  20MG  dr capsules has been APPROVED from 10/10/2023 to 10/09/2024. Ran test claim, Copay is $1.03. This test claim was processed through [facility]- copay amounts may vary at other pharmacies due to pharmacy/plan contracts, or as the patient moves through the different stages of their insurance plan.    Key: [id number] PA #/Case ID/Reference #: [id number]

## 2023-10-19 ENCOUNTER — Ambulatory Visit: Payer: 59 | Admitting: Student in an Organized Health Care Education/Training Program

## 2023-10-20 ENCOUNTER — Encounter: Payer: Self-pay | Admitting: Internal Medicine

## 2023-10-20 NOTE — Telephone Encounter (Signed)
 See if she can do a virtual visit tomorrow

## 2023-10-21 ENCOUNTER — Other Ambulatory Visit: Payer: Self-pay

## 2023-10-21 ENCOUNTER — Encounter: Payer: Self-pay | Admitting: Internal Medicine

## 2023-10-21 ENCOUNTER — Telehealth: Payer: 59 | Admitting: Internal Medicine

## 2023-10-21 DIAGNOSIS — J4540 Moderate persistent asthma, uncomplicated: Secondary | ICD-10-CM

## 2023-10-21 DIAGNOSIS — F419 Anxiety disorder, unspecified: Secondary | ICD-10-CM

## 2023-10-21 MED ORDER — BUSPIRONE HCL 5 MG PO TABS: 5.0000 mg | ORAL_TABLET | Freq: Every day | ORAL | 1 refills | Status: DC

## 2023-10-21 NOTE — Progress Notes (Signed)
 Patient ID: JHANAE JASKOWIAK, female   DOB: December 02, 1975, 48 y.o.   MRN: [id number]   Virtual Visit via video Note  I connected with Kelly [name] by a video enabled telemedicine application and verified that I am speaking with the correct person using two identifiers. Location patient: home Location provider: work  Persons participating in the virtual visit: patient, provider  The limitations, risks, security and privacy concerns of performing an evaluation and management service by video and the availability of in person appointments have been discussed. It has also been discussed with the patient that there may be a patient responsible charge related to this service. The patient expressed understanding and agreed to proceed.   Reason for visit: work in appt  HPI: Work in - to discuss increased anxiety. Increased stress/anxiety related to her husband's health issues. Her husband has had some ongoing issues with passing out. W/up in progress. Increased stress with not knowing when these episodes will happen and not knowing why they are occurring. She is currently on zoloft. Feels needs something more to help level things out. Of note, recently had noravirus. Doing better now. Eating. No nausea or vomiting. Bowels back to normal.    ROS: See pertinent positives and negatives per HPI.  Past Medical History:  Diagnosis Date   ADHD    Allergy    Anxiety    Asthma    COVID-19    x 3 as of 06/01/22   Fatigue    GERD (gastroesophageal reflux disease)     Past Surgical History:  Procedure Laterality Date   LAMINECTOMY      Family History  Problem Relation Age of Onset   Hypertension Mother    Non-Hodgkin's lymphoma Father    Lymphoma Father    Cancer Father    Cancer - Cervical Father    Breast cancer Maternal Aunt    Colon polyps Neg Hx    Colon cancer Neg Hx    Rectal cancer Neg Hx    Stomach cancer Neg Hx     SOCIAL HX: reviewed.    Current Outpatient Medications:     amphetamine-dextroamphetamine (ADDERALL) 20 MG tablet, Take 20 mg by mouth as needed. One tablet daily as needed, Disp: , Rfl:    budesonide-formoterol (SYMBICORT) 160-4.5 MCG/ACT inhaler, Inhale 2 puffs into the lungs in the morning and at bedtime., Disp: 3 each, Rfl: 12   busPIRone (BUSPAR) 5 MG tablet, Take 1 tablet (5 mg total) by mouth daily., Disp: 30 tablet, Rfl: 1   cetirizine (ZYRTEC) 10 MG tablet, Take 10 mg by mouth daily., Disp: , Rfl:    levonorgestrel (MIRENA) 20 MCG/24HR IUD, 1 each by Intrauterine route once., Disp: , Rfl:    montelukast (SINGULAIR) 10 MG tablet, TAKE ONE TABLET BY MOUTH AT BEDTIME, Disp: 90 tablet, Rfl: 3   omeprazole (PRILOSEC) 20 MG capsule, TAKE ONE CAPSULE BY MOUTH ONCE DAILY, Disp: 90 capsule, Rfl: 3   sertraline (ZOLOFT) 100 MG tablet, TAKE ONE TABLET BY MOUTH ONCE A DAY, Disp: 90 tablet, Rfl: 1   VITAMIN D, CHOLECALCIFEROL, PO, Take by mouth., Disp: , Rfl:   EXAM:  GENERAL: alert, oriented, appears well and in no acute distress  HEENT: atraumatic, conjunttiva clear, no obvious abnormalities on inspection of external nose and ears  NECK: normal movements of the head and neck  LUNGS: on inspection no signs of respiratory distress, breathing rate appears normal, no obvious gross SOB, gasping or wheezing  CV: no obvious cyanosis  PSYCH/NEURO:  pleasant and cooperative, no obvious depression or anxiety, speech and thought processing grossly intact  ASSESSMENT AND PLAN:  Discussed the following assessment and plan:  Problem List Items Addressed This Visit     Anxiety - Primary   Increased stress/anxiety as outlined. Discussed treatment options. Currently on zoloft. Will add buspar 5mg . She will keep me posted on how she is doing. May need to adjust dose. Follow. Call with update.       Relevant Medications   busPIRone (BUSPAR) 5 MG tablet   Asthma   Breathing stable.        Return if symptoms worsen or fail to improve.   I discussed the  assessment and treatment plan with the patient. The patient was provided an opportunity to ask questions and all were answered. The patient agreed with the plan and demonstrated an understanding of the instructions.   The patient was advised to call back or seek an in-person evaluation if the symptoms worsen or if the condition fails to improve as anticipated.    Dale Hoisington, MD

## 2023-10-21 NOTE — Telephone Encounter (Signed)
 Pt scheduled

## 2023-10-24 ENCOUNTER — Encounter: Payer: Self-pay | Admitting: Internal Medicine

## 2023-10-24 NOTE — Assessment & Plan Note (Signed)
 Breathing stable.

## 2023-10-24 NOTE — Assessment & Plan Note (Signed)
 Increased stress/anxiety as outlined. Discussed treatment options. Currently on zoloft. Will add buspar 5mg . She will keep me posted on how she is doing. May need to adjust dose. Follow. Call with update.

## 2023-11-01 ENCOUNTER — Ambulatory Visit (INDEPENDENT_AMBULATORY_CARE_PROVIDER_SITE_OTHER): Payer: 59 | Admitting: Internal Medicine

## 2023-11-01 DIAGNOSIS — F419 Anxiety disorder, unspecified: Secondary | ICD-10-CM

## 2023-11-01 NOTE — Progress Notes (Deleted)
 Subjective:    Patient ID: Kelly [name], female    DOB: [birth year], 48 y.o.   MRN: [id number]  Patient here for No chief complaint on file.   HPI Here for a scheduled follow up. Recently evaluated - increased stress and anxiety. On zoloft. Started buspar. Continues on symbicort and singulair - asthma.    Past Medical History:  Diagnosis Date   ADHD    Allergy    Anxiety    Asthma    COVID-19    x 3 as of 06/01/22   Fatigue    GERD (gastroesophageal reflux disease)    Past Surgical History:  Procedure Laterality Date   LAMINECTOMY     Family History  Problem Relation Age of Onset   Hypertension Mother    Non-Hodgkin's lymphoma Father    Lymphoma Father    Cancer Father    Cancer - Cervical Father    Breast cancer Maternal Aunt    Colon polyps Neg Hx    Colon cancer Neg Hx    Rectal cancer Neg Hx    Stomach cancer Neg Hx    Social History   Socioeconomic History   Marital status: Married    Spouse name: Not on file   Number of children: Not on file   Years of education: Not on file   Highest education level: Not on file  Occupational History   Not on file  Tobacco Use   Smoking status: Never   Smokeless tobacco: Never  Vaping Use   Vaping status: Never Used  Substance and Sexual Activity   Alcohol use: No   Drug use: No   Sexual activity: Yes    Birth control/protection: I.U.D.    Comment: mierna  Other Topics Concern   Not on file  Social History Narrative   Physiological scientist own business    Social Drivers of Health   Financial Resource Strain: Low Risk  (12/24/2022)   Overall Financial Resource Strain (CARDIA)    Difficulty of Paying Living Expenses: Not hard at all  Food Insecurity: No Food Insecurity (12/24/2022)   Hunger Vital Sign    Worried About Running Out of Food in the Last Year: Never true    Ran Out of Food in the Last Year: Never true  Transportation Needs: No Transportation Needs (12/24/2022)   PRAPARE - Therapist, art (Medical): No    Lack of Transportation (Non-Medical): No  Physical Activity: Unknown (12/24/2022)   Exercise Vital Sign    Days of Exercise per Week: 2 days    Minutes of Exercise per Session: Not on file  Stress: No Stress Concern Present (12/24/2022)   [employer] of Occupational Health - Occupational Stress Questionnaire    Feeling of Stress : Only a little  Social Connections: Unknown (12/24/2022)   Social Connection and Isolation Panel [NHANES]    Frequency of Communication with Friends and Family: Three times a week    Frequency of Social Gatherings with Friends and Family: Twice a week    Attends Religious Services: 1 to 4 times per year    Active Member of [employer] or Organizations: Not on file    Attends Banker Meetings: Not on file    Marital Status: Married     Review of Systems     Objective:     There were no vitals taken for this visit. Wt Readings from Last 3 Encounters:  09/21/23 175 lb (79.4 kg)  09/06/23  178 lb (80.7 kg)  08/03/23 184 lb 3.2 oz (83.6 kg)    Physical Exam  {Perform Simple Foot Exam  Perform Detailed exam:1} {Insert foot Exam (Optional):30965}   Outpatient Encounter Medications as of 11/01/2023  Medication Sig   amphetamine-dextroamphetamine (ADDERALL) 20 MG tablet Take 20 mg by mouth as needed. One tablet daily as needed   budesonide-formoterol (SYMBICORT) 160-4.5 MCG/ACT inhaler Inhale 2 puffs into the lungs in the morning and at bedtime.   busPIRone (BUSPAR) 5 MG tablet Take 1 tablet (5 mg total) by mouth daily.   cetirizine (ZYRTEC) 10 MG tablet Take 10 mg by mouth daily.   levonorgestrel (MIRENA) 20 MCG/24HR IUD 1 each by Intrauterine route once.   montelukast (SINGULAIR) 10 MG tablet TAKE ONE TABLET BY MOUTH AT BEDTIME   omeprazole (PRILOSEC) 20 MG capsule TAKE ONE CAPSULE BY MOUTH ONCE DAILY   sertraline (ZOLOFT) 100 MG tablet TAKE ONE TABLET BY MOUTH ONCE A DAY   VITAMIN D, CHOLECALCIFEROL,  PO Take by mouth.   No facility-administered encounter medications on file as of 11/01/2023.     Lab Results  Component Value Date   WBC 6.8 05/30/2023   HGB 14.0 05/30/2023   HCT 42.2 05/30/2023   PLT 234 05/30/2023   GLUCOSE CANCELED 05/18/2022   CHOL 199 04/23/2022   TRIG 121.0 04/23/2022   HDL 46.60 04/23/2022   LDLDIRECT 115.0 02/05/2021   LDLCALC 129 (H) 04/23/2022   ALT 23 04/23/2022   AST 16 04/23/2022   NA 138 04/23/2022   K 4.3 04/23/2022   CL 107 04/23/2022   CREATININE 0.76 04/23/2022   BUN 12 04/23/2022   CO2 23 04/23/2022   TSH 2.04 04/23/2022   INR 1.0 04/12/2018   HGBA1C 5.6 05/18/2022    DG PAIN CLINIC C-ARM 1-60 MIN NO REPORT Result Date: 09/21/2023 Fluoro was used, but no Radiologist interpretation will be provided. Please refer to "NOTES" tab for provider progress note.      Assessment & Plan:  There are no diagnoses linked to this encounter.   Dale Laureles, MD

## 2023-11-02 ENCOUNTER — Ambulatory Visit
Payer: 59 | Attending: Student in an Organized Health Care Education/Training Program | Admitting: Student in an Organized Health Care Education/Training Program

## 2023-11-02 ENCOUNTER — Encounter: Payer: Self-pay | Admitting: Student in an Organized Health Care Education/Training Program

## 2023-11-02 VITALS — BP 115/87 | HR 83 | Temp 97.5°F | Resp 16 | Ht 63.0 in | Wt 170.0 lb

## 2023-11-02 DIAGNOSIS — G8929 Other chronic pain: Secondary | ICD-10-CM | POA: Insufficient documentation

## 2023-11-02 DIAGNOSIS — M722 Plantar fascial fibromatosis: Secondary | ICD-10-CM | POA: Insufficient documentation

## 2023-11-02 DIAGNOSIS — G894 Chronic pain syndrome: Secondary | ICD-10-CM | POA: Diagnosis not present

## 2023-11-02 DIAGNOSIS — M5416 Radiculopathy, lumbar region: Secondary | ICD-10-CM | POA: Insufficient documentation

## 2023-11-02 NOTE — Progress Notes (Signed)
 PROVIDER NOTE: Information contained herein reflects review and annotations entered in association with encounter. Interpretation of such information and data should be left to medically-trained personnel. Information provided to patient can be located elsewhere in the medical record under "Patient Instructions". Document created using STT-dictation technology, any transcriptional errors that may result from process are unintentional.    Patient: Kelly [name]  Service Category: E/M  Provider: Edward Jolly, MD  DOB: [birth year]  DOS: 11/02/2023  Referring Provider: Dale Munden, MD  MRN: [id number]  Specialty: Interventional Pain Management  PCP: Dale Two Strike, MD  Type: Established Patient  Setting: Ambulatory outpatient    Location: Office  Delivery: Face-to-face     HPI  Ms. Kelly [name], a 48 y.o. year old female, is here today because of her Chronic radicular lumbar pain [M54.16, G89.29]. Ms. Mcjunkin primary complain today is Back Pain (lower)  Pertinent problems: Ms. Rieth does not have any pertinent problems on file. Pain Assessment: Severity of Chronic pain is reported as a 0-No pain/10. Location: Back Right/pain no longer goes down leg. Onset: More than a month ago. Quality:  ("Good"). Timing:  . Modifying factor(s): patient has not taken any medications since procedur, relax if needed. Vitals:  height is 5\' 3"  (1.6 m) and weight is 170 lb (77.1 kg). Her temperature is 97.5 F (36.4 C) (abnormal). Her blood pressure is 115/87 and her pulse is 83. Her respiration is 16 and oxygen saturation is 98%.  BMI: Estimated body mass index is 30.11 kg/m as calculated from the following:   Height as of this encounter: 5\' 3"  (1.6 m).   Weight as of this encounter: 170 lb (77.1 kg). Last encounter: 09/06/2023. Last procedure: 09/21/2023.  Reason for encounter: post-procedure evaluation and assessment.    Post-procedure evaluation     Procedure1:          Anesthesia, Analgesia,  Anxiolysis:  Bilateral plantar fascia injections for plantar fasciitis  Type: Local Anesthesia Local Anesthetic: Lidocaine 1-2%   Position: Prone   Bilateral Plantar fasciitis    Procedure2: Lumbar trans-foraminal epidural steroid injection (L-TFESI) #1  Laterality: Right (-RT)  Level: L5 nerve root(s) Imaging: Fluoroscopy-guided         Anesthesia: Local anesthesia (1-2% Lidocaine) Sedation: Minimal Sedation                       DOS: 09/21/2023  Performed by: Edward Jolly, MD       Effectiveness:  Initial hour after procedure: 100 %  Subsequent 4-6 hours post-procedure: 100 %  Analgesia past initial 6 hours: 100 % (1005back, 95% right foot)  Ongoing improvement:  Analgesic:  100% right low back and right leg with L5 TF ESI, 95% for b/l feet after plantar fascia injection Function: Somewhat improved ROM: Somewhat improved    ROS  Constitutional: Denies any fever or chills Gastrointestinal: No reported hemesis, hematochezia, vomiting, or acute GI distress Musculoskeletal: Denies any acute onset joint swelling, redness, loss of ROM, or weakness Neurological: No reported episodes of acute onset apraxia, aphasia, dysarthria, agnosia, amnesia, paralysis, loss of coordination, or loss of consciousness  Medication Review  Cholecalciferol, amphetamine-dextroamphetamine, budesonide-formoterol, busPIRone, cetirizine, levonorgestrel, montelukast, omeprazole, and sertraline  History Review  Allergy: Ms. Kibler has no known allergies. Drug: Ms. Nistler  reports no history of drug use. Alcohol:  reports no history of alcohol use. Tobacco:  reports that she has never smoked. She has never used smokeless tobacco. Social: Ms. Stroope  reports that she has never  smoked. She has never used smokeless tobacco. She reports that she does not drink alcohol and does not use drugs. Medical:  has a past medical history of ADHD, Allergy, Anxiety, Asthma, COVID-19, Fatigue, and GERD  (gastroesophageal reflux disease). Surgical: Ms. Upshaw  has a past surgical history that includes Laminectomy. Family: family history includes Breast cancer in her maternal aunt; Cancer in her father; Cancer - Cervical in her father; Hypertension in her mother; Lymphoma in her father; Non-Hodgkin's lymphoma in her father.  Laboratory Chemistry Profile   Renal Lab Results  Component Value Date   BUN 12 04/23/2022   CREATININE 0.76 04/23/2022   BCR 15 04/12/2018   GFR 94.44 04/23/2022   GFRAA 106 04/12/2018   GFRNONAA 92 04/12/2018    Hepatic Lab Results  Component Value Date   AST 16 04/23/2022   ALT 23 04/23/2022   ALBUMIN 4.2 04/23/2022   ALKPHOS 65 04/23/2022    Electrolytes Lab Results  Component Value Date   NA 138 04/23/2022   K 4.3 04/23/2022   CL 107 04/23/2022   CALCIUM 9.7 04/23/2022    Bone Lab Results  Component Value Date   VD25OH 38.51 04/23/2022    Inflammation (CRP: Acute Phase) (ESR: Chronic Phase) Lab Results  Component Value Date   CRP <1.0 01/12/2019   ESRSEDRATE 11 01/12/2019         Note: Above Lab results reviewed.  Recent Imaging Review  DG PAIN CLINIC C-ARM 1-60 MIN NO REPORT Fluoro was used, but no Radiologist interpretation will be provided.  Please refer to "NOTES" tab for provider progress note. Note: Reviewed        Physical Exam  General appearance: Well nourished, well developed, and well hydrated. In no apparent acute distress Mental status: Alert, oriented x 3 (person, place, & time)       Respiratory: No evidence of acute respiratory distress Eyes: PERLA Vitals: BP 115/87   Pulse 83   Temp (!) 97.5 F (36.4 C)   Resp 16   Ht 5\' 3"  (1.6 m)   Wt 170 lb (77.1 kg)   SpO2 98%   BMI 30.11 kg/m  BMI: Estimated body mass index is 30.11 kg/m as calculated from the following:   Height as of this encounter: 5\' 3"  (1.6 m).   Weight as of this encounter: 170 lb (77.1 kg). Ideal: Ideal body weight: 52.4 kg (115 lb 8.3  oz) Adjusted ideal body weight: 62.3 kg (137 lb 5 oz)  Improvement in low back and radiating right leg pain  Improvement in plantar fascia pain as well  Assessment   Diagnosis Status  1. Chronic radicular lumbar pain (right L5)   2. Lumbar radiculopathy   3. Plantar fasciitis, bilateral   4. Chronic pain syndrome    Controlled Controlled Controlled   Updated Problems: No problems updated.  Plan of Care  Excellent response to interventions as detailed above.  Follow-up as needed.  As needed order placed below.  Orders:  Orders Placed This Encounter  Procedures   Lumbar Transforaminal Epidural    Standing Status:   Standing    Number of Occurrences:   3    Next Expected Occurrence:   01/20/2024    Expiration Date:   11/01/2024    Scheduling Instructions:     Right L5 TF ESI PRN    Where will this procedure be performed?:   [facility]   Injection tendon or ligament    Standing Status:   Standing  Number of Occurrences:   2    Next Expected Occurrence:   01/20/2024    Expiration Date:   11/01/2024    Scheduling Instructions:     Right plantar fascia injection PRN   Follow-up plan:   Return if symptoms worsen or fail to improve.      Right L5 epidural steroid injection, B/L plantar fascial injections    Recent Visits Date Type Provider Dept  09/21/23 Procedure visit Edward Jolly, MD [facility]  09/06/23 Office Visit Edward Jolly, MD [facility]  Showing recent visits within past 90 days and meeting all other requirements Today's Visits Date Type Provider Dept  11/02/23 Office Visit Edward Jolly, MD [facility]  Showing today's visits and meeting all other requirements Future Appointments No visits were found meeting these conditions. Showing future appointments within next 90 days and meeting all other requirements  I discussed the assessment and treatment plan with the patient. The patient was provided an opportunity  to ask questions and all were answered. The patient agreed with the plan and demonstrated an understanding of the instructions.  Patient advised to call back or seek an in-person evaluation if the symptoms or condition worsens.  Duration of encounter: 20 minutes.  Total time on encounter, as per AMA guidelines included both the face-to-face and non-face-to-face time personally spent by the physician and/or other qualified health care professional(s) on the day of the encounter (includes time in activities that require the physician or other qualified health care professional and does not include time in activities normally performed by clinical staff). Physician's time may include the following activities when performed: Preparing to see the patient (e.g., pre-charting review of records, searching for previously ordered imaging, lab work, and nerve conduction tests) Review of prior analgesic pharmacotherapies. Reviewing PMP Interpreting ordered tests (e.g., lab work, imaging, nerve conduction tests) Performing post-procedure evaluations, including interpretation of diagnostic procedures Obtaining and/or reviewing separately obtained history Performing a medically appropriate examination and/or evaluation Counseling and educating the patient/family/caregiver Ordering medications, tests, or procedures Referring and communicating with other health care professionals (when not separately reported) Documenting clinical information in the electronic or other health record Independently interpreting results (not separately reported) and communicating results to the patient/ family/caregiver Care coordination (not separately reported)  Note by: Edward Jolly, MD Date: 11/02/2023; Time: 2:42 PM

## 2023-11-02 NOTE — Patient Instructions (Signed)

## 2023-11-06 ENCOUNTER — Encounter: Payer: Self-pay | Admitting: Internal Medicine

## 2023-11-06 NOTE — Progress Notes (Signed)
 Patient ID: Kelly [name], female   DOB: 04/29/1976, 48 y.o.   MRN: [id number] Did not show for appt.

## 2024-01-02 ENCOUNTER — Telehealth: Payer: Self-pay | Admitting: Internal Medicine

## 2024-01-02 DIAGNOSIS — Z1231 Encounter for screening mammogram for malignant neoplasm of breast: Secondary | ICD-10-CM

## 2024-01-02 NOTE — Telephone Encounter (Signed)
 Received notification she is overdue a mammogram. Need to schedule. Thanks.

## 2024-01-04 NOTE — Telephone Encounter (Signed)
 Patient is scheduled for mammo with GYN 5/28. Order for [place] cancelled.

## 2024-01-04 NOTE — Addendum Note (Signed)
 Addended by: Victorino Grates D on: 01/04/2024 09:46 AM   Modules accepted: Orders

## 2024-01-10 ENCOUNTER — Telehealth: Payer: Self-pay | Admitting: Internal Medicine

## 2024-01-10 NOTE — Telephone Encounter (Signed)
 Pt scheduled for mammo with GYN 5/28.

## 2024-01-10 NOTE — Telephone Encounter (Signed)
 I received another notification that she is overdue her mammogram. Please call pt to schedule.  Also, she missed her last appt with me. Needs a f/u appt scheduled.  Thanks.

## 2024-01-25 DIAGNOSIS — Z113 Encounter for screening for infections with a predominantly sexual mode of transmission: Secondary | ICD-10-CM | POA: Diagnosis not present

## 2024-01-25 DIAGNOSIS — Z124 Encounter for screening for malignant neoplasm of cervix: Secondary | ICD-10-CM | POA: Diagnosis not present

## 2024-01-25 DIAGNOSIS — Z01411 Encounter for gynecological examination (general) (routine) with abnormal findings: Secondary | ICD-10-CM | POA: Diagnosis not present

## 2024-01-25 DIAGNOSIS — Z01419 Encounter for gynecological examination (general) (routine) without abnormal findings: Secondary | ICD-10-CM | POA: Diagnosis not present

## 2024-01-25 DIAGNOSIS — R232 Flushing: Secondary | ICD-10-CM | POA: Diagnosis not present

## 2024-01-25 DIAGNOSIS — Z1231 Encounter for screening mammogram for malignant neoplasm of breast: Secondary | ICD-10-CM | POA: Diagnosis not present

## 2024-03-07 ENCOUNTER — Encounter: Payer: Self-pay | Admitting: Internal Medicine

## 2024-03-07 DIAGNOSIS — D6851 Activated protein C resistance: Secondary | ICD-10-CM

## 2024-03-08 NOTE — Telephone Encounter (Signed)
 Order placed for hematology referral. Pt notified.

## 2024-04-05 ENCOUNTER — Inpatient Hospital Stay

## 2024-04-05 ENCOUNTER — Encounter: Payer: Self-pay | Admitting: Oncology

## 2024-04-05 ENCOUNTER — Inpatient Hospital Stay: Attending: Oncology | Admitting: Oncology

## 2024-04-05 VITALS — BP 139/88 | HR 90 | Temp 98.4°F | Resp 18 | Ht 63.0 in | Wt 185.0 lb

## 2024-04-05 DIAGNOSIS — D6851 Activated protein C resistance: Secondary | ICD-10-CM | POA: Diagnosis not present

## 2024-04-05 NOTE — Progress Notes (Unsigned)
 [facility]  [phone] [phone]  ID: Kelly [name] OB: 1976-07-20  MR#: [id number]  RDW#:[id number]  Patient Care Team: Glendia Shad, MD as PCP - General (Internal Medicine) Jacobo Evalene PARAS, MD as Consulting Physician (Oncology)  CHIEF COMPLAINT: Heterozygote factor V Leiden.  INTERVAL HISTORY: Patient is a 48 year old female with no personal or family history of blood clot, but underwent genetic testing approximately 6 years ago and was found incidentally to be heterozygote for factor V Leiden.  Her mother was tested and is negative, her father passed away at the age of 48 from non-Hodgkin's lymphoma and is presumed to be the carrier.  She is undergoing tummy tuck surgery and is referred for risk assessment and clearance.  She currently feels well and is asymptomatic.  She has no neurologic complaints.  She denies any recent fevers or illnesses.  She has a good appetite and denies weight loss.  She has no chest pain, shortness of breath, cough, or hemoptysis.  She denies any nausea, vomiting, constipation, or diarrhea.  She has no urinary complaints.  Patient feels her baseline and offers no specific complaints today.  REVIEW OF SYSTEMS:   Review of Systems  Constitutional: Negative.  Negative for fever, malaise/fatigue and weight loss.  Respiratory: Negative.  Negative for cough, hemoptysis and shortness of breath.   Cardiovascular: Negative.  Negative for chest pain and leg swelling.  Gastrointestinal: Negative.  Negative for abdominal pain.  Genitourinary: Negative.  Negative for dysuria.  Musculoskeletal: Negative.  Negative for back pain.  Skin: Negative.  Negative for rash.  Neurological: Negative.  Negative for dizziness, focal weakness, weakness and headaches.  Psychiatric/Behavioral: Negative.  The patient is not nervous/anxious.     As per HPI. Otherwise, a complete review of systems is negative.  PAST MEDICAL  HISTORY: Past Medical History:  Diagnosis Date   ADHD    Allergy    Anxiety    Asthma    COVID-19    x 3 as of 06/01/22   Fatigue    GERD (gastroesophageal reflux disease)     PAST SURGICAL HISTORY: Past Surgical History:  Procedure Laterality Date   LAMINECTOMY      FAMILY HISTORY: Family History  Problem Relation Age of Onset   Hypertension Mother    Non-Hodgkin's lymphoma Father    Lymphoma Father    Cancer Father    Cancer - Cervical Father    Breast cancer Maternal Aunt    Colon polyps Neg Hx    Colon cancer Neg Hx    Rectal cancer Neg Hx    Stomach cancer Neg Hx     ADVANCED DIRECTIVES (Y/N):  N  HEALTH MAINTENANCE: Social History   Tobacco Use   Smoking status: Never   Smokeless tobacco: Never  Vaping Use   Vaping status: Never Used  Substance Use Topics   Alcohol use: No   Drug use: No     Colonoscopy:  PAP:  Bone density:  Lipid panel:  No Known Allergies  Current Outpatient Medications  Medication Sig Dispense Refill   amphetamine -dextroamphetamine  (ADDERALL) 20 MG tablet Take 20 mg by mouth as needed. One tablet daily as needed     budesonide -formoterol  (SYMBICORT ) 160-4.5 MCG/ACT inhaler Inhale 2 puffs into the lungs in the morning and at bedtime. 3 each 12   busPIRone  (BUSPAR ) 5 MG tablet Take 1 tablet (5 mg total) by mouth daily. 30 tablet 1   cetirizine (ZYRTEC) 10 MG tablet Take 10 mg by mouth  daily.     levonorgestrel (MIRENA) 20 MCG/24HR IUD 1 each by Intrauterine route once.     montelukast  (SINGULAIR ) 10 MG tablet TAKE ONE TABLET BY MOUTH AT BEDTIME 90 tablet 3   omeprazole  (PRILOSEC) 20 MG capsule TAKE ONE CAPSULE BY MOUTH ONCE DAILY 90 capsule 3   sertraline  (ZOLOFT ) 100 MG tablet TAKE ONE TABLET BY MOUTH ONCE A DAY 90 tablet 1   VITAMIN D , CHOLECALCIFEROL, PO Take by mouth.     No current facility-administered medications for this visit.    OBJECTIVE: Vitals:   04/05/24 1020  BP: 139/88  Pulse: 90  Resp: 18  Temp: 98.4  F (36.9 C)  SpO2: 98%     Body mass index is 32.77 kg/m.    ECOG FS:0 - Asymptomatic  General: Well-developed, well-nourished, no acute distress. Eyes: Pink conjunctiva, anicteric sclera. HEENT: Normocephalic, moist mucous membranes. Lungs: No audible wheezing or coughing. Heart: Regular rate and rhythm. Abdomen: Soft, nontender, no obvious distention. Musculoskeletal: No edema, cyanosis, or clubbing. Neuro: Alert, answering all questions appropriately. Cranial nerves grossly intact. Skin: No rashes or petechiae noted. Psych: Normal affect. Lymphatics: No cervical, calvicular, axillary or inguinal LAD.   LAB RESULTS:  Lab Results  Component Value Date   NA 138 04/23/2022   K 4.3 04/23/2022   CL 107 04/23/2022   CO2 23 04/23/2022   GLUCOSE CANCELED 05/18/2022   BUN 12 04/23/2022   CREATININE 0.76 04/23/2022   CALCIUM 9.7 04/23/2022   PROT 6.4 04/23/2022   ALBUMIN 4.2 04/23/2022   AST 16 04/23/2022   ALT 23 04/23/2022   ALKPHOS 65 04/23/2022   BILITOT 0.5 04/23/2022   GFRNONAA 92 04/12/2018   GFRAA 106 04/12/2018    Lab Results  Component Value Date   WBC 6.8 05/30/2023   NEUTROABS 3.8 05/30/2023   HGB 14.0 05/30/2023   HCT 42.2 05/30/2023   MCV 87.7 05/30/2023   PLT 234 05/30/2023     STUDIES: No results found.  ASSESSMENT: Heterozygote factor V Leiden.  PLAN:    Heterozygote factor V Leiden: Found incidentally on genetic testing in 2019.  Patient does not have a personal or family history of blood clot.  By definition, patient has approximately 2-5 fold increased risk of DVT over the general population.  This should not prevent her from undergoing surgery.  Recommend standard prophylactic procedures surrounding and during her surgery.  Postoperatively it was emphasized to patient that she should wear compression stockings for at least 7 to 10 days afterward as well as maintain her mobility.  If surgeon continues to feel risk is too high, can also consider  prophylactic Lovenox at 1.5 mg/kg daily for 7 days postop.  No further intervention is needed.  No follow-up has been scheduled.  Please refer patient back if there are any questions or concerns.  I spent a total of 60 minutes reviewing chart data, face-to-face evaluation with the patient, counseling and coordination of care as detailed above.   Patient expressed understanding and was in agreement with this plan. She also understands that She can call clinic at any time with any questions, concerns, or complaints.    Evalene JINNY Reusing, MD   04/06/2024 10:10 AM

## 2024-04-05 NOTE — Progress Notes (Unsigned)
 Patient had recently did the genetic testing and the results came back with her having factor 5 Leiden mutation. As of right now she isn't having any symptoms.

## 2024-04-16 ENCOUNTER — Other Ambulatory Visit: Payer: Self-pay | Admitting: Internal Medicine

## 2024-05-15 ENCOUNTER — Other Ambulatory Visit: Payer: Self-pay | Admitting: Internal Medicine

## 2024-06-09 ENCOUNTER — Other Ambulatory Visit: Payer: Self-pay | Admitting: Pulmonary Disease

## 2024-06-09 ENCOUNTER — Encounter: Payer: Self-pay | Admitting: Internal Medicine

## 2024-06-09 DIAGNOSIS — J4550 Severe persistent asthma, uncomplicated: Secondary | ICD-10-CM

## 2024-06-11 ENCOUNTER — Other Ambulatory Visit: Payer: Self-pay | Admitting: Pulmonary Disease

## 2024-06-11 DIAGNOSIS — J4550 Severe persistent asthma, uncomplicated: Secondary | ICD-10-CM

## 2024-06-11 NOTE — Telephone Encounter (Signed)
 Copied from CRM [id number]. Topic: Clinical - Medication Refill >> Jun 11, 2024 11:26 AM Isabell A wrote: Medication: budesonide -formoterol  (SYMBICORT ) 160-4.5 MCG/ACT inhaler [[id number]]  Has the patient contacted their pharmacy? Yes (Agent: If no, request that the patient contact the pharmacy for the refill. If patient does not wish to contact the pharmacy document the reason why and proceed with request.) (Agent: If yes, when and what did the pharmacy advise?)  This is the patient's preferred pharmacy:  [id number] [address] [facility] - [address] [place] [address] Phone: [phone] Fax: [phone]  Is this the correct pharmacy for this prescription? Yes If no, delete pharmacy and type the correct one.   Has the prescription been filled recently? Yes  Is the patient out of the medication? Yes  Has the patient been seen for an appointment in the last year OR does the patient have an upcoming appointment? Yes  Can we respond through MyChart? No  Agent: Please be advised that Rx refills may take up to 3 business days. We ask that you follow-up with your pharmacy.

## 2024-06-18 ENCOUNTER — Other Ambulatory Visit: Payer: Self-pay | Admitting: Pulmonary Disease

## 2024-06-18 ENCOUNTER — Other Ambulatory Visit: Payer: Self-pay

## 2024-06-18 DIAGNOSIS — J4550 Severe persistent asthma, uncomplicated: Secondary | ICD-10-CM

## 2024-06-18 MED ORDER — BUDESONIDE-FORMOTEROL FUMARATE 160-4.5 MCG/ACT IN AERO: 2.0000 | INHALATION_SPRAY | Freq: Two times a day (BID) | RESPIRATORY_TRACT | 12 refills | Status: AC

## 2024-06-25 ENCOUNTER — Encounter: Payer: Self-pay | Admitting: Nurse Practitioner

## 2024-06-25 ENCOUNTER — Ambulatory Visit: Admitting: Nurse Practitioner

## 2024-06-25 VITALS — BP 124/76 | HR 86 | Temp 97.6°F | Ht 63.0 in | Wt 184.2 lb

## 2024-06-25 DIAGNOSIS — J4540 Moderate persistent asthma, uncomplicated: Secondary | ICD-10-CM | POA: Diagnosis not present

## 2024-06-25 NOTE — Progress Notes (Signed)
 @Patient  ID: Kelly [name], female    DOB: 05/04/1976, 48 y.o.   MRN: [id number]  Chief Complaint  Patient presents with   Asthma    Referring provider: Glendia Shad, MD  HPI: 48 year old female, never smoker followed for asthma. She is a patient of Dr. Nelda and last seen in office 05/30/2023. Past medical history significant for GERD, factor 5 Leiden mutation, anxiety, ADHD.   TEST/EVENTS:  05/30/2023 CXR: clear lungs  05/30/2023: OV with Dr. Malka. Establish care. Diagnosed with asthma in childhood. Initially only symptomatic during exercise and high pollen. Past 3-4 years, symptoms persistent so was started on Symbicort  80 mcg twice daily and albuterol  PRN. She also has vocal cord paralysis, diagnosed at [facility]. Never hospitalized for asthma and never required intubation. Typical symptoms are chest tightness and wheezing. Hypersensitive to strong perfumes and high humidity, as well as smoke. Doing well overall. Using albuterol  twice a week. Stepped up to high dose Symbicort .   06/25/2024: Today - follow up Discussed the use of AI scribe software for clinical note transcription with the patient, who gave verbal consent to proceed.  History of Present Illness Kelly [name] is a 48 year old female with asthma who presents for a follow-up visit.  Her asthma has been well-controlled over the past year, with no need for steroids or antibiotics. She has an albuterol  rescue inhaler at home, which she used several times a day when she was without Symbicort  for a couple of weeks. With the resumption of Symbicort , she now uses the albuterol  inhaler only once or twice a week. She feels she is well managed on the Symbicort .   No nighttime symptoms such as waking up short of breath or wheezing, and she has not experienced any cough. In the past four weeks, her asthma has not limited her activities at work or home. Doesn't feel like she has any issues with her breathing. No wheezing  currently. Cleaning her mouth after Symbicort  use. No recent exacerbations or hospitalizations. Takes zyrtec and singulair  for allergies, which helps.   Asthma Control Test ACT Total Score  06/25/2024  2:02 PM 24        No Known Allergies  Immunization History  Administered Date(s) Administered   Influenza,inj,Quad PF,6+ Mos 06/25/2020, 09/29/2022   Influenza-Unspecified 05/31/2016, 05/30/2021   PFIZER(Purple Top)SARS-COV-2 Vaccination 04/24/2020, 05/15/2020    Past Medical History:  Diagnosis Date   ADHD    Allergy    Anxiety    Asthma    COVID-19    x 3 as of 06/01/22   Fatigue    GERD (gastroesophageal reflux disease)     Tobacco History: Social History   Tobacco Use  Smoking Status Never  Smokeless Tobacco Never   Counseling given: Not Answered   Outpatient Medications Prior to Visit  Medication Sig Dispense Refill   amphetamine -dextroamphetamine  (ADDERALL) 20 MG tablet Take 20 mg by mouth as needed. One tablet daily as needed     budesonide -formoterol  (SYMBICORT ) 160-4.5 MCG/ACT inhaler Inhale 2 puffs into the lungs in the morning and at bedtime. 3 each 12   busPIRone  (BUSPAR ) 5 MG tablet Take 1 tablet (5 mg total) by mouth daily. 30 tablet 1   cetirizine (ZYRTEC) 10 MG tablet Take 10 mg by mouth daily.     levonorgestrel (MIRENA) 20 MCG/24HR IUD 1 each by Intrauterine route once.     montelukast  (SINGULAIR ) 10 MG tablet TAKE ONE TABLET BY MOUTH AT BEDTIME 90 tablet 1   omeprazole  (  PRILOSEC) 20 MG capsule TAKE ONE CAPSULE BY MOUTH ONCE DAILY 90 capsule 3   sertraline  (ZOLOFT ) 100 MG tablet TAKE ONE TABLET BY MOUTH ONCE A DAY 90 tablet 1   VITAMIN D , CHOLECALCIFEROL, PO Take by mouth.     No facility-administered medications prior to visit.     Review of Systems: as above    Physical Exam:  BP 124/76   Pulse 86   Temp 97.6 F (36.4 C) (Temporal)   Ht 5' 3 (1.6 m)   Wt 184 lb 3.2 oz (83.6 kg)   SpO2 97%   BMI 32.63 kg/m   GEN: Pleasant,  interactive, well-appearing; obese; in no acute distress HEENT:  Normocephalic and atraumatic. PERRLA. Sclera white. Nasal turbinates pink, moist and patent bilaterally. No rhinorrhea present. Oropharynx pink and moist, without exudate or edema. No lesions, ulcerations, or postnasal drip.  NECK:  Supple w/ fair ROM CV: RRR, no m/r/g PULMONARY:  Unlabored, regular breathing. Clear bilaterally A&P w/o wheezes/rales/rhonchi. No accessory muscle use.  GI: BS present and normoactive. Soft, non-tender to palpation.  MSK: No erythema, warmth or tenderness.  Neuro: A/Ox3. No focal deficits noted.   Skin: Warm, no lesions or rashe Psych: Normal affect and behavior. Judgement and thought content appropriate.     Lab Results:  CBC    Component Value Date/Time   WBC 6.8 05/30/2023 1544   RBC 4.81 05/30/2023 1544   HGB 14.0 05/30/2023 1544   HGB 13.2 04/12/2018 1200   HCT 42.2 05/30/2023 1544   HCT 41.4 04/12/2018 1200   PLT 234 05/30/2023 1544   PLT 209 04/12/2018 1200   MCV 87.7 05/30/2023 1544   MCV 81 04/12/2018 1200   MCH 29.1 05/30/2023 1544   MCHC 33.2 05/30/2023 1544   RDW 12.5 05/30/2023 1544   RDW 19.5 (H) 04/12/2018 1200   LYMPHSABS 2.4 05/30/2023 1544   LYMPHSABS 1.8 04/12/2018 1200   MONOABS 0.3 05/30/2023 1544   EOSABS 0.2 05/30/2023 1544   EOSABS 0.2 04/12/2018 1200   BASOSABS 0.1 05/30/2023 1544   BASOSABS 0.0 04/12/2018 1200    BMET    Component Value Date/Time   NA 138 04/23/2022 0855   NA 141 04/12/2018 1200   K 4.3 04/23/2022 0855   CL 107 04/23/2022 0855   CO2 23 04/23/2022 0855   GLUCOSE CANCELED 05/18/2022 0901   BUN 12 04/23/2022 0855   BUN 12 04/12/2018 1200   CREATININE 0.76 04/23/2022 0855   CALCIUM 9.7 04/23/2022 0855   GFRNONAA 92 04/12/2018 1200   GFRAA 106 04/12/2018 1200    BNP No results found for: BNP   Imaging:  No results found.  Administration History     None           No data to display          No results  found for: NITRICOXIDE      Assessment & Plan:   Asthma Well controlled on current regimen. No recent exacerbations. Recent refill of Symbicort . Action plan in place. Trigger prevention reviewed.   Patient Instructions  Continue Symbicort  2 puffs Twice daily. Brush tongue and rinse mouth afterwards Continue Albuterol  inhaler 2 puffs every 6 hours as needed for shortness of breath or wheezing. Notify if symptoms persist despite rescue inhaler/neb use. Continue zyrtec 1 tab daily  Continue montelukast  1 tab daily   Follow up in one year with Dr. Malka. If symptoms do not improve or worsen, please contact office for sooner follow up or seek  emergency care.    Advised if symptoms do not improve or worsen, to please contact office for sooner follow up or seek emergency care.   I spent 25 minutes of dedicated to the care of this patient on the date of this encounter to include pre-visit review of records, face-to-face time with the patient discussing conditions above, post visit ordering of testing, clinical documentation with the electronic health record, making appropriate referrals as documented, and communicating necessary findings to members of the patients care team.  Comer LULLA Rouleau, NP 06/25/2024  Pt aware and understands NP's role.

## 2024-06-25 NOTE — Patient Instructions (Addendum)
 Continue Symbicort  2 puffs Twice daily. Brush tongue and rinse mouth afterwards Continue Albuterol  inhaler 2 puffs every 6 hours as needed for shortness of breath or wheezing. Notify if symptoms persist despite rescue inhaler/neb use. Continue zyrtec 1 tab daily  Continue montelukast  1 tab daily   Follow up in one year with Dr. Malka. If symptoms do not improve or worsen, please contact office for sooner follow up or seek emergency care.

## 2024-06-25 NOTE — Assessment & Plan Note (Signed)
 Well controlled on current regimen. No recent exacerbations. Recent refill of Symbicort . Action plan in place. Trigger prevention reviewed.   Patient Instructions  Continue Symbicort  2 puffs Twice daily. Brush tongue and rinse mouth afterwards Continue Albuterol  inhaler 2 puffs every 6 hours as needed for shortness of breath or wheezing. Notify if symptoms persist despite rescue inhaler/neb use. Continue zyrtec 1 tab daily  Continue montelukast  1 tab daily   Follow up in one year with Dr. Malka. If symptoms do not improve or worsen, please contact office for sooner follow up or seek emergency care.

## 2024-07-09 DIAGNOSIS — N951 Menopausal and female climacteric states: Secondary | ICD-10-CM | POA: Diagnosis not present

## 2024-07-10 ENCOUNTER — Encounter: Payer: Self-pay | Admitting: Internal Medicine

## 2024-07-11 NOTE — Telephone Encounter (Signed)
 There are future labs in EPIC. Yes, she can schedule a lab appt. If agreeable, I would also like to check cholesterol.  If agrees, orders are in EPIC. Ok to schedule fasting lab appt next week.

## 2024-07-11 NOTE — Telephone Encounter (Signed)
 Pt in agreement. Appts made

## 2024-07-11 NOTE — Telephone Encounter (Signed)
 Ok to use 08/07/24 at 11:00 thanks

## 2024-07-13 ENCOUNTER — Other Ambulatory Visit: Payer: Self-pay | Admitting: Internal Medicine

## 2024-07-19 ENCOUNTER — Other Ambulatory Visit (INDEPENDENT_AMBULATORY_CARE_PROVIDER_SITE_OTHER)

## 2024-07-19 ENCOUNTER — Encounter: Payer: Self-pay | Admitting: Oncology

## 2024-07-19 DIAGNOSIS — R739 Hyperglycemia, unspecified: Secondary | ICD-10-CM | POA: Diagnosis not present

## 2024-07-19 DIAGNOSIS — E7800 Pure hypercholesterolemia, unspecified: Secondary | ICD-10-CM

## 2024-07-19 LAB — LIPID PANEL
Cholesterol: 197 mg/dL (ref 0–200)
HDL: 47.9 mg/dL (ref >39.00)
LDL Cholesterol: 131 mg/dL — ABNORMAL HIGH (ref 0–99)
NonHDL: 149.32
Total CHOL/HDL Ratio: 4
Triglycerides: 93 mg/dL (ref 0.0–149.0)
VLDL: 18.6 mg/dL (ref 0.0–40.0)

## 2024-07-19 LAB — BASIC METABOLIC PANEL WITH GFR
BUN: 10 mg/dL (ref 6–23)
CO2: 26 meq/L (ref 19–32)
Calcium: 9.8 mg/dL (ref 8.4–10.5)
Chloride: 107 meq/L (ref 96–112)
Creatinine, Ser: 0.73 mg/dL (ref 0.40–1.20)
GFR: 97.57 mL/min (ref >60.00)
Glucose, Bld: 95 mg/dL (ref 70–99)
Potassium: 4.3 meq/L (ref 3.5–5.1)
Sodium: 139 meq/L (ref 135–145)

## 2024-07-19 LAB — HEPATIC FUNCTION PANEL
ALT: 16 U/L (ref 0–35)
AST: 13 U/L (ref 0–37)
Albumin: 4.4 g/dL (ref 3.5–5.2)
Alkaline Phosphatase: 69 U/L (ref 39–117)
Bilirubin, Direct: 0.1 mg/dL (ref 0.0–0.3)
Total Bilirubin: 0.5 mg/dL (ref 0.2–1.2)
Total Protein: 6.7 g/dL (ref 6.0–8.3)

## 2024-07-19 LAB — HEMOGLOBIN A1C: Hgb A1c MFr Bld: 5.2 % (ref 4.6–6.5)

## 2024-07-19 LAB — TSH: TSH: 2.22 u[IU]/mL (ref 0.35–5.50)

## 2024-07-20 NOTE — Telephone Encounter (Signed)
 Faxed md recommendations to attn? Manuelita , RN at [facility] [phone]. Fax confirmation [email or web address]

## 2024-07-23 ENCOUNTER — Ambulatory Visit: Payer: Self-pay | Admitting: Internal Medicine

## 2024-08-07 ENCOUNTER — Ambulatory Visit: Admitting: Internal Medicine

## 2024-08-07 ENCOUNTER — Encounter: Payer: Self-pay | Admitting: Internal Medicine

## 2024-08-07 VITALS — BP 120/70 | HR 70 | Temp 98.8°F | Ht 63.0 in | Wt 183.0 lb

## 2024-08-07 DIAGNOSIS — R739 Hyperglycemia, unspecified: Secondary | ICD-10-CM

## 2024-08-07 DIAGNOSIS — Z23 Encounter for immunization: Secondary | ICD-10-CM | POA: Diagnosis not present

## 2024-08-07 DIAGNOSIS — E7800 Pure hypercholesterolemia, unspecified: Secondary | ICD-10-CM | POA: Diagnosis not present

## 2024-08-07 DIAGNOSIS — Z0000 Encounter for general adult medical examination without abnormal findings: Secondary | ICD-10-CM

## 2024-08-07 DIAGNOSIS — D649 Anemia, unspecified: Secondary | ICD-10-CM

## 2024-08-07 DIAGNOSIS — K219 Gastro-esophageal reflux disease without esophagitis: Secondary | ICD-10-CM | POA: Diagnosis not present

## 2024-08-07 DIAGNOSIS — F419 Anxiety disorder, unspecified: Secondary | ICD-10-CM | POA: Diagnosis not present

## 2024-08-07 DIAGNOSIS — Z975 Presence of (intrauterine) contraceptive device: Secondary | ICD-10-CM | POA: Diagnosis not present

## 2024-08-07 DIAGNOSIS — E559 Vitamin D deficiency, unspecified: Secondary | ICD-10-CM

## 2024-08-07 LAB — CBC WITH DIFFERENTIAL/PLATELET
Basophils Absolute: 0.1 K/uL (ref 0.0–0.1)
Basophils Relative: 0.9 % (ref 0.0–3.0)
Eosinophils Absolute: 0.1 K/uL (ref 0.0–0.7)
Eosinophils Relative: 1.6 % (ref 0.0–5.0)
HCT: 42 % (ref 36.0–46.0)
Hemoglobin: 14.2 g/dL (ref 12.0–15.0)
Lymphocytes Relative: 27.1 % (ref 12.0–46.0)
Lymphs Abs: 2.2 K/uL (ref 0.7–4.0)
MCHC: 33.7 g/dL (ref 30.0–36.0)
MCV: 87.1 fl (ref 78.0–100.0)
Monocytes Absolute: 0.5 K/uL (ref 0.1–1.0)
Monocytes Relative: 6.4 % (ref 3.0–12.0)
Neutro Abs: 5.2 K/uL (ref 1.4–7.7)
Neutrophils Relative %: 64 % (ref 43.0–77.0)
Platelets: 218 K/uL (ref 150.0–400.0)
RBC: 4.82 Mil/uL (ref 3.87–5.11)
RDW: 12.7 % (ref 11.5–15.5)
WBC: 8.1 K/uL (ref 4.0–10.5)

## 2024-08-07 MED ORDER — SERTRALINE HCL 100 MG PO TABS: 100.0000 mg | ORAL_TABLET | Freq: Every day | ORAL | 1 refills | Status: AC

## 2024-08-07 MED ORDER — MONTELUKAST SODIUM 10 MG PO TABS: 10.0000 mg | ORAL_TABLET | Freq: Every day | ORAL | 1 refills | Status: AC

## 2024-08-07 MED ORDER — BUSPIRONE HCL 5 MG PO TABS: 5.0000 mg | ORAL_TABLET | Freq: Every day | ORAL | 1 refills | Status: AC

## 2024-08-07 NOTE — Assessment & Plan Note (Signed)
 Low-carb diet and exercise.  Follow met b and A1c.

## 2024-08-07 NOTE — Assessment & Plan Note (Signed)
 Check vitamin D level with next labs.  ?

## 2024-08-07 NOTE — Assessment & Plan Note (Addendum)
 The 10-year ASCVD risk score (Arnett DK, et al., 2019) is: 0.9%   Values used to calculate the score:     Age: 48 years     Clincally relevant sex: Female     Is Non-Hispanic African American: No     Diabetic: No     Tobacco smoker: No     Systolic Blood Pressure: 114 mmHg     Is BP treated: No     HDL Cholesterol: 47.9 mg/dL     Total Cholesterol: 197 mg/dL  Low cholesterol diet and exercise.

## 2024-08-07 NOTE — Progress Notes (Unsigned)
 Subjective:    Patient ID: Kelly [name], female    DOB: 06-17-1976, 48 y.o.   MRN: [id number]  Patient here for  Chief Complaint  Patient presents with   Annual Exam    HPI Here for a physical exam. Evaluated by gyn 01/25/24 - annual visit. Mirena since 2019 due to menorrhagia and resultant anemia. Recommended estradiol patch. PAP 12/2023. F/u 07/09/24 - doing well on estradiol patch. Reported sleeping better. F/u with pulmonary - 05/2023 - well managed on symbicort . Continue zyrtec and singulair . Planning tummy tuck. Evaluated by Dr Jacobo 03/2024 - f/u heterozygote factor V leiden. Recommended compression stockings for at least 7-10 days after surgery. Maintain mobility. Consider lovenox. Has been on zoloft  and buspar .    Past Medical History:  Diagnosis Date   ADHD    Allergy 1988   Anxiety 2000   Asthma 1985   COVID-19    x 3 as of 06/01/22   Fatigue    GERD (gastroesophageal reflux disease) 2005   Past Surgical History:  Procedure Laterality Date   LAMINECTOMY     SPINE SURGERY  2007   laminectomy   Family History  Problem Relation Age of Onset   Hypertension Mother    Non-Hodgkin's lymphoma Father    Lymphoma Father    Cancer Father    Cancer - Cervical Father    Breast cancer Maternal Aunt    Colon polyps Neg Hx    Colon cancer Neg Hx    Rectal cancer Neg Hx    Stomach cancer Neg Hx    Social History   Socioeconomic History   Marital status: Married    Spouse name: Not on file   Number of children: Not on file   Years of education: Not on file   Highest education level: Not on file  Occupational History   Not on file  Tobacco Use   Smoking status: Never   Smokeless tobacco: Never  Vaping Use   Vaping status: Never Used  Substance and Sexual Activity   Alcohol use: No   Drug use: No   Sexual activity: Yes    Birth control/protection: I.U.D.    Comment: mierna  Other Topics Concern   Not on file  Social History Narrative   Environmental consultant own business    Social Drivers of Health   Financial Resource Strain: Low Risk  (12/24/2022)   Overall Financial Resource Strain (CARDIA)    Difficulty of Paying Living Expenses: Not hard at all  Food Insecurity: No Food Insecurity (12/24/2022)   Hunger Vital Sign    Worried About Running Out of Food in the Last Year: Never true    Ran Out of Food in the Last Year: Never true  Transportation Needs: No Transportation Needs (12/24/2022)   PRAPARE - Administrator, Civil Service (Medical): No    Lack of Transportation (Non-Medical): No  Physical Activity: Unknown (12/24/2022)   Exercise Vital Sign    Days of Exercise per Week: 2 days    Minutes of Exercise per Session: Not on file  Stress: No Stress Concern Present (12/24/2022)   [employer] of Occupational Health - Occupational Stress Questionnaire    Feeling of Stress : Only a little  Social Connections: Unknown (12/24/2022)   Social Connection and Isolation Panel    Frequency of Communication with Friends and Family: Three times a week    Frequency of Social Gatherings with Friends and Family: Twice a week  Attends Religious Services: 1 to 4 times per year    Active Member of Clubs or Organizations: Not on file    Attends Banker Meetings: Not on file    Marital Status: Married     Review of Systems     Objective:     BP 120/70   Pulse 70   Temp 98.8 F (37.1 C) (Oral)   Ht 5' 3 (1.6 m)   Wt 183 lb (83 kg)   SpO2 97%   BMI 32.42 kg/m  Wt Readings from Last 3 Encounters:  08/07/24 183 lb (83 kg)  06/25/24 184 lb 3.2 oz (83.6 kg)  04/05/24 185 lb (83.9 kg)    Physical Exam  {Perform Simple Foot Exam  Perform Detailed exam:1} {Insert foot Exam (Optional):30965}   Outpatient Encounter Medications as of 08/07/2024  Medication Sig   CELEBREX 200 MG capsule Take 200 mg by mouth once.   estradiol (VIVELLE-DOT) 0.05 MG/24HR patch Apply 1 patch twice a week by transdermal  route for 30 days.   HYDROcodone -acetaminophen  (NORCO/VICODIN) 5-325 MG tablet Take 1 tablet by mouth every 4 (four) hours as needed.   LOVENOX 40 MG/0.4ML injection Inject into the skin daily.   ondansetron (ZOFRAN) 4 MG tablet Take 4 tablet by mouth as directed 15 minutes PRIOR to arrival for surgery After surgery, take 1 tablet every 6 hours for nausea, as needed.   amphetamine -dextroamphetamine  (ADDERALL) 20 MG tablet Take 20 mg by mouth as needed. One tablet daily as needed   budesonide -formoterol  (SYMBICORT ) 160-4.5 MCG/ACT inhaler Inhale 2 puffs into the lungs in the morning and at bedtime.   busPIRone  (BUSPAR ) 5 MG tablet Take 1 tablet (5 mg total) by mouth daily.   cetirizine (ZYRTEC) 10 MG tablet Take 10 mg by mouth daily.   levonorgestrel (MIRENA) 20 MCG/24HR IUD 1 each by Intrauterine route once.   montelukast  (SINGULAIR ) 10 MG tablet Take 1 tablet (10 mg total) by mouth at bedtime.   omeprazole  (PRILOSEC) 20 MG capsule TAKE ONE CAPSULE BY MOUTH ONCE DAILY   sertraline  (ZOLOFT ) 100 MG tablet Take 1 tablet (100 mg total) by mouth daily.   VITAMIN D , CHOLECALCIFEROL, PO Take by mouth.   [DISCONTINUED] busPIRone  (BUSPAR ) 5 MG tablet Take 1 tablet (5 mg total) by mouth daily.   [DISCONTINUED] montelukast  (SINGULAIR ) 10 MG tablet TAKE ONE TABLET BY MOUTH AT BEDTIME   [DISCONTINUED] sertraline  (ZOLOFT ) 100 MG tablet TAKE ONE TABLET BY MOUTH ONCE A DAY   No facility-administered encounter medications on file as of 08/07/2024.     Lab Results  Component Value Date   WBC 6.8 05/30/2023   HGB 14.0 05/30/2023   HCT 42.2 05/30/2023   PLT 234 05/30/2023   GLUCOSE 95 07/19/2024   CHOL 197 07/19/2024   TRIG 93.0 07/19/2024   HDL 47.90 07/19/2024   LDLDIRECT 115.0 02/05/2021   LDLCALC 131 (H) 07/19/2024   ALT 16 07/19/2024   AST 13 07/19/2024   NA 139 07/19/2024   K 4.3 07/19/2024   CL 107 07/19/2024   CREATININE 0.73 07/19/2024   BUN 10 07/19/2024   CO2 26 07/19/2024   TSH 2.22  07/19/2024   INR 1.0 04/12/2018   HGBA1C 5.2 07/19/2024    DG PAIN CLINIC C-ARM 1-60 MIN NO REPORT Result Date: 09/21/2023 Fluoro was used, but no Radiologist interpretation will be provided. Please refer to NOTES tab for provider progress note.      Assessment & Plan:  Vitamin D  deficiency Assessment &  Plan: Check vitamin D  level with next labs.   Orders: -     VITAMIN D  25 Hydroxy (Vit-D Deficiency, Fractures); Future  Hypercholesterolemia Assessment & Plan: The 10-year ASCVD risk score (Arnett DK, et al., 2019) is: 0.9%   Values used to calculate the score:     Age: 21 years     Clincally relevant sex: Female     Is Non-Hispanic African American: No     Diabetic: No     Tobacco smoker: No     Systolic Blood Pressure: 114 mmHg     Is BP treated: No     HDL Cholesterol: 47.9 mg/dL     Total Cholesterol: 197 mg/dL  Low cholesterol diet and exercise.   Orders: -     Basic metabolic panel with GFR; Future -     Hepatic function panel; Future -     Lipid panel; Future -     CBC with Differential/Platelet  Hyperglycemia Assessment & Plan: Low carb diet and exercise. Follow met b and A1c.   Orders: -     Hemoglobin A1c; Future  Healthcare maintenance Assessment & Plan: Physical today 08/07/24. PAP through [facility] 01/26/24 - need results.  Need mammogram results. Colonoscopy 11/2019 - normal. Recommended f/u colonoscopy in 10 years.    Need for influenza vaccination -     Flu vaccine trivalent PF, 6mos and older(Flulaval,Afluria,Fluarix,Fluzone)  Other orders -     busPIRone  HCl; Take 1 tablet (5 mg total) by mouth daily.  Dispense: 30 tablet; Refill: 1 -     Montelukast  Sodium; Take 1 tablet (10 mg total) by mouth at bedtime.  Dispense: 90 tablet; Refill: 1 -     Sertraline  HCl; Take 1 tablet (100 mg total) by mouth daily.  Dispense: 90 tablet; Refill: 1     Allena Hamilton, MD

## 2024-08-07 NOTE — Assessment & Plan Note (Signed)
 Physical today 08/07/24. PAP through [facility] 01/26/24 - need results.  Need mammogram results. Colonoscopy 11/2019 - normal. Recommended f/u colonoscopy in 10 years.

## 2024-08-08 ENCOUNTER — Ambulatory Visit: Payer: Self-pay | Admitting: Internal Medicine

## 2024-08-18 ENCOUNTER — Encounter: Payer: Self-pay | Admitting: Internal Medicine

## 2024-08-18 NOTE — Assessment & Plan Note (Signed)
 Follow cbc.

## 2024-08-18 NOTE — Assessment & Plan Note (Signed)
No upper symptoms reported.  On omeprazole.  S/p EGD 12/21/19 

## 2024-08-18 NOTE — Assessment & Plan Note (Signed)
 Evaluated by gyn 01/25/24 - annual visit. Mirena since 2019 due to menorrhagia and resultant anemia. Recommended estradiol patch. PAP 12/2023. F/u 07/09/24 - doing well on estradiol patch. Reported sleeping better.

## 2024-08-18 NOTE — Assessment & Plan Note (Signed)
 Continue on zoloft /buspar . Overall appears to be doing well. Follow.

## 2024-12-06 ENCOUNTER — Other Ambulatory Visit

## 2024-12-11 ENCOUNTER — Ambulatory Visit: Admitting: Internal Medicine
